# Patient Record
Sex: Female | Born: 1965
Health system: Southern US, Community
[De-identification: ages and names within clinical notes are randomized; demographics above are authoritative.]

## PROBLEM LIST (undated history)

## (undated) DIAGNOSIS — R112 Nausea with vomiting, unspecified: Secondary | ICD-10-CM

## (undated) DIAGNOSIS — L570 Actinic keratosis: Secondary | ICD-10-CM

## (undated) DIAGNOSIS — S42209A Unspecified fracture of upper end of unspecified humerus, initial encounter for closed fracture: Secondary | ICD-10-CM

## (undated) DIAGNOSIS — Z86718 Personal history of other venous thrombosis and embolism: Secondary | ICD-10-CM

## (undated) DIAGNOSIS — Z9889 Other specified postprocedural states: Secondary | ICD-10-CM

## (undated) DIAGNOSIS — K589 Irritable bowel syndrome without diarrhea: Secondary | ICD-10-CM

## (undated) HISTORY — DX: Irritable bowel syndrome without diarrhea: K58.9

## (undated) HISTORY — PX: FOOT SURGERY: SHX648

## (undated) HISTORY — DX: Personal history of other venous thrombosis and embolism: Z86.718

## (undated) HISTORY — PX: OVARIAN CYST SURGERY: SHX726

## (undated) HISTORY — DX: Actinic keratosis: L57.0

## (undated) HISTORY — PX: BREAST ENHANCEMENT SURGERY: SHX7

---

## 2010-12-05 ENCOUNTER — Ambulatory Visit: Payer: Self-pay | Admitting: Emergency Medicine

## 2010-12-05 DIAGNOSIS — M79609 Pain in unspecified limb: Secondary | ICD-10-CM

## 2011-01-21 NOTE — Assessment & Plan Note (Signed)
Summary: INJURED FINGER/TM   Vital Signs:  Patient Profile:   45 Years Old Female CC:      injured Rt 2nd digit Height:     66.5 inches Weight:      179.25 pounds O2 Sat:      100 % O2 treatment:    Room Air Temp:     98.2 degrees F oral Pulse rate:   74 / minute Resp:     18 per minute BP sitting:   104 / 66  (left arm) Cuff size:   regular  Vitals Entered By: Clemens Catholic LPN (December 05, 2010 12:39 PM)                  Updated Prior Medication List: SPRINTEC 28 0.25-35 MG-MCG TABS (NORGESTIMATE-ETH ESTRADIOL)   Current Allergies: No known allergies History of Present Illness Chief Complaint: injured Rt 2nd digit History of Present Illness: Pt complains of Rt 2nd finger pain. Location: Onset: 5 weeks Description/Quality of Pain:dull,throbbing Intensity of pain: mild  Modifying Factors: worse when bend Trauma: Jammed finger putting on a boot 5 wks ago assoc.Symptoms: swelled up immediately after jamming it 5 wks ago. Swelling much better now. No numbness or weakness.   REVIEW OF SYSTEMS Constitutional Symptoms      Denies fever, chills, night sweats, weight loss, weight gain, and fatigue.  Eyes       Denies change in vision, eye pain, eye discharge, glasses, contact lenses, and eye surgery. Ear/Nose/Throat/Mouth       Denies hearing loss/aids, change in hearing, ear pain, ear discharge, dizziness, frequent runny nose, frequent nose bleeds, sinus problems, sore throat, hoarseness, and tooth pain or bleeding.  Respiratory       Denies dry cough, productive cough, wheezing, shortness of breath, asthma, bronchitis, and emphysema/COPD.  Cardiovascular       Denies murmurs, chest pain, and tires easily with exhertion.    Gastrointestinal       Denies stomach pain, nausea/vomiting, diarrhea, constipation, blood in bowel movements, and indigestion. Genitourniary       Denies painful urination, kidney stones, and loss of urinary control. Neurological  Denies paralysis, seizures, and fainting/blackouts. Musculoskeletal       Complains of joint pain.      Denies muscle pain, joint stiffness, decreased range of motion, redness, swelling, muscle weakness, and gout.  Skin       Denies bruising, unusual mles/lumps or sores, and hair/skin or nail changes.  Psych       Denies mood changes, temper/anger issues, anxiety/stress, speech problems, depression, and sleep problems. Other Comments: pt injured RT 2nd digit x 4-5 wks ago still having swelling and throbbing on and off.   Past History:  Past Medical History: Unremarkable  Past Surgical History: ovarian cyst nasal surgery (polyps)  Vital Signs:  Patient profile:   45 Years Old Female Height:      66.5 inches Weight:      179.25 pounds O2 Sat:      100 % Temp:     98.2 degrees F oral Pulse rate:   74 / minute Resp:     18 per minute BP sitting:   104 / 66 Cuff size:   regular   Family History: Family History of Melanoma  Social History: Never Smoked Alcohol use-yes 2-3 times per yr Drug use-no Smoking Status:  never Drug Use:  no Physical Exam General appearance: well developed, well nourished, no acute distress Extremities: R index finger: minimal swelling and tenderness  DIP. Pain exacerbated by forced flexion. ROM,motor,sensory, tendon fxn all intact. No instability . Normal color and temperature. Nl capillary rf. Skin: no obvious rashes or lesions Assessment New Problems: FINGER PAIN (ICD-729.5) FAMILY HISTORY OF MELANOMA (ICD-V16.8)  xray normal, per radiologist  Patient Education: rom excersices discussed.  Plan New Orders: T-DG Finger Index*R* [73140] New Patient Level III [40981] Planning Comments:   Pt declined f/u appt with ortho. If not improved in 1-2 wks, pt will call back our office, and we can refer to othopedic hand specialist.   The patient and/or caregiver has been counseled thoroughly with regard to medications prescribed including dosage,  schedule, interactions, rationale for use, and possible side effects and they verbalize understanding.  Diagnoses and expected course of recovery discussed and will return if not improved as expected or if the condition worsens. Patient and/or caregiver verbalized understanding.   Orders Added: 1)  T-DG Finger Index*R* [73140] 2)  New Patient Level III [19147]

## 2012-04-17 DIAGNOSIS — Z85828 Personal history of other malignant neoplasm of skin: Secondary | ICD-10-CM | POA: Insufficient documentation

## 2012-04-17 DIAGNOSIS — L570 Actinic keratosis: Secondary | ICD-10-CM

## 2012-04-17 HISTORY — DX: Actinic keratosis: L57.0

## 2012-10-23 DIAGNOSIS — W891XXA Exposure to tanning bed, initial encounter: Secondary | ICD-10-CM | POA: Insufficient documentation

## 2013-10-29 DIAGNOSIS — Z808 Family history of malignant neoplasm of other organs or systems: Secondary | ICD-10-CM | POA: Insufficient documentation

## 2014-09-04 LAB — HM PAP SMEAR

## 2014-10-01 DIAGNOSIS — Z86718 Personal history of other venous thrombosis and embolism: Secondary | ICD-10-CM

## 2014-10-01 HISTORY — DX: Personal history of other venous thrombosis and embolism: Z86.718

## 2017-03-05 DIAGNOSIS — L03031 Cellulitis of right toe: Secondary | ICD-10-CM | POA: Diagnosis not present

## 2017-03-14 DIAGNOSIS — Z1231 Encounter for screening mammogram for malignant neoplasm of breast: Secondary | ICD-10-CM | POA: Diagnosis not present

## 2017-03-14 LAB — HM MAMMOGRAPHY

## 2017-06-04 DIAGNOSIS — R12 Heartburn: Secondary | ICD-10-CM | POA: Diagnosis not present

## 2017-06-04 DIAGNOSIS — R1011 Right upper quadrant pain: Secondary | ICD-10-CM | POA: Diagnosis not present

## 2017-06-04 DIAGNOSIS — R110 Nausea: Secondary | ICD-10-CM | POA: Diagnosis not present

## 2017-06-06 ENCOUNTER — Ambulatory Visit (INDEPENDENT_AMBULATORY_CARE_PROVIDER_SITE_OTHER): Payer: 59 | Admitting: Family Medicine

## 2017-06-06 ENCOUNTER — Encounter: Payer: Self-pay | Admitting: Family Medicine

## 2017-06-06 VITALS — BP 88/55 | HR 71 | Temp 98.3°F | Ht 66.0 in | Wt 148.0 lb

## 2017-06-06 DIAGNOSIS — K581 Irritable bowel syndrome with constipation: Secondary | ICD-10-CM | POA: Diagnosis not present

## 2017-06-06 DIAGNOSIS — R1013 Epigastric pain: Secondary | ICD-10-CM

## 2017-06-06 DIAGNOSIS — K589 Irritable bowel syndrome without diarrhea: Secondary | ICD-10-CM

## 2017-06-06 DIAGNOSIS — Z1211 Encounter for screening for malignant neoplasm of colon: Secondary | ICD-10-CM

## 2017-06-06 DIAGNOSIS — Z86718 Personal history of other venous thrombosis and embolism: Secondary | ICD-10-CM

## 2017-06-06 DIAGNOSIS — R109 Unspecified abdominal pain: Secondary | ICD-10-CM | POA: Insufficient documentation

## 2017-06-06 HISTORY — DX: Irritable bowel syndrome, unspecified: K58.9

## 2017-06-06 LAB — CBC
HEMATOCRIT: 39.1 % (ref 35.0–45.0)
Hemoglobin: 13.1 g/dL (ref 11.7–15.5)
MCH: 30.5 pg (ref 27.0–33.0)
MCHC: 33.5 g/dL (ref 32.0–36.0)
MCV: 90.9 fL (ref 80.0–100.0)
MPV: 9 fL (ref 7.5–12.5)
Platelets: 264 10*3/uL (ref 140–400)
RBC: 4.3 MIL/uL (ref 3.80–5.10)
RDW: 13.1 % (ref 11.0–15.0)
WBC: 5.2 10*3/uL (ref 3.8–10.8)

## 2017-06-06 MED ORDER — CELECOXIB 200 MG PO CAPS
ORAL_CAPSULE | ORAL | 2 refills | Status: DC
Start: 2017-06-06 — End: 2019-07-30

## 2017-06-06 MED ORDER — OMEPRAZOLE 40 MG PO CPDR
40.0000 mg | DELAYED_RELEASE_CAPSULE | Freq: Every day | ORAL | 3 refills | Status: DC
Start: 2017-06-06 — End: 2018-10-18

## 2017-06-06 MED ORDER — LUBIPROSTONE 24 MCG PO CAPS: 24.0000 ug | ORAL_CAPSULE | Freq: Two times a day (BID) | ORAL | 3 refills | Status: DC

## 2017-06-06 NOTE — Progress Notes (Signed)
Betty Lucas is a 51 y.o. female who presents to [place]: Betty Lucas today for epigastric abdominal pain and vomiting. Patient notes a six-month history of abdominal bloating and nausea and occasional vomiting. This is associated with episodes of acid reflux and sour taste in her mouth. Symptoms worsened about a week ago after meal and she developed vomiting. She was seen in urgent care where she was prescribed zofran which helped a bit. She also has tried an occasional Zantac which didn't help much at all either. She is a pertinent medical history for IBS typically constipation type. This is typically well controlled with Amitiza. Patient notes that she has been taking Aleve relatively frequently for episodes of pain.    Past Medical History:  Diagnosis Date  . Actinic keratosis 04/17/2012  . History of deep vein thrombosis (DVT) of lower extremity 10/01/2014   Rt after fracture around 2011  . IBS (irritable bowel syndrome) 06/06/2017   Past Surgical History:  Procedure Laterality Date  . BREAST ENHANCEMENT SURGERY    . FOOT SURGERY    . OVARIAN CYST SURGERY     Social History  Substance Use Topics  . Smoking status: Never Smoker  . Smokeless tobacco: Never Used  . Alcohol use Yes   family history includes Cancer in her father and paternal aunt; Hyperlipidemia in her mother.  ROS: Nonew headache, visual changes, nausea, vomiting, diarrhea, constipation, dizziness, abdominal pain, skin rash, fevers, chills, night sweats, weight loss, swollen lymph nodes, body aches, joint swelling, muscle aches, chest pain, shortness of breath, mood changes, visual or auditory hallucinations.    Medications: Current Outpatient Prescriptions  Medication Sig Dispense Refill  . Probiotic Product (PROBIOTIC DAILY PO) Take by mouth.    . celecoxib (CELEBREX) 200 MG capsule One to 2 tablets  by mouth daily as needed for pain. This replaces Aleve 60 capsule 2  . lubiprostone (AMITIZA) 24 MCG capsule Take 1 capsule (24 mcg total) by mouth 2 (two) times daily with a meal. 180 capsule 3  . omeprazole (PRILOSEC) 40 MG capsule Take 1 capsule (40 mg total) by mouth daily. 30 capsule 3   No current facility-administered medications for this visit.    No Known Allergies  Health Maintenance Health Maintenance  Topic Date Due  . HIV Screening  06/24/1981  . PAP SMEAR  06/25/1987  . MAMMOGRAM  06/24/2016  . COLONOSCOPY  06/24/2016  . INFLUENZA VACCINE  07/20/2017  . TETANUS/TDAP  12/07/2025     Exam:  BP (!) 88/55   Pulse 71   Temp 98.3 F (36.8 C) (Oral)   Ht 5\' 6"  (1.676 m)   Wt 148 lb (67.1 kg)   SpO2 98%   BMI 23.89 kg/m  Gen: Well NAD HEENT: EOMI,  MMM Lungs: Normal work of breathing. CTABL Heart: RRR no MRG Abd: NABS, Soft. Nondistended, Nontender no CV angle TTP.  Exts: Brisk capillary refill, warm and well perfused.    No results found for this or any previous visit (from the past 72 hour(s)). No results found.    Assessment and Plan: 51 y.o. female with  Epigastric abdominal pain I suspect this is probably gastritis. Patient uses NSAIDs and irregularly. I think this could certainly be a cause. Plan for workup including H. pylori test. Additionally we'll start treatment with omeprazole and switching from Aleve to Celebrex. Further workup will include CBC metabolic panel and lipase.  IBS: Refill Amitiza.  Screening: Referral to gastrology for screening colonoscopy. They may do an upper endoscopy at the same time if that is reasonably warranted at that time.   Orders Placed This Encounter  Procedures  . CBC  . COMPLETE METABOLIC PANEL WITH GFR  . Lipase  . H. pylori breath test  . Ambulatory referral to Gastroenterology    Referral Priority:   Routine    Referral Type:   Consultation    Referral Reason:   Specialty Services Required    Requested  Specialty:   Gastroenterology    Number of Visits Requested:   1   Meds ordered this encounter  Medications  . DISCONTD: doxycycline (VIBRA-TABS) 100 MG tablet  . DISCONTD: polyethylene glycol powder (GLYCOLAX/MIRALAX) powder  . DISCONTD: ondansetron (ZOFRAN-ODT) 4 MG disintegrating tablet  . Probiotic Product (PROBIOTIC DAILY PO)    Sig: Take by mouth.  . celecoxib (CELEBREX) 200 MG capsule    Sig: One to 2 tablets by mouth daily as needed for pain. This replaces Aleve    Dispense:  60 capsule    Refill:  2  . omeprazole (PRILOSEC) 40 MG capsule    Sig: Take 1 capsule (40 mg total) by mouth daily.    Dispense:  30 capsule    Refill:  3  . lubiprostone (AMITIZA) 24 MCG capsule    Sig: Take 1 capsule (24 mcg total) by mouth 2 (two) times daily with a meal.    Dispense:  180 capsule    Refill:  3     Discussed warning signs or symptoms. Please see discharge instructions. Patient expresses understanding.

## 2017-06-06 NOTE — Patient Instructions (Addendum)
Thank you for coming in today. Do the breath test and labs today.  We will start some medicine.  Start omeprazole daily.  Also stop aleve or ibuporofen. Use clebrex instead.   Recheck in 1 month.   You should hear about the colonscopy soon. Let me know if you do not hear anything.    Gastritis, Adult Gastritis is inflammation of the stomach. There are two kinds of gastritis:  Acute gastritis. This kind develops suddenly.  Chronic gastritis. This kind lasts for a long time.  Gastritis happens when the lining of the stomach becomes weak or gets damaged. Without treatment, gastritis can lead to stomach bleeding and ulcers. What are the causes? This condition may be caused by:  An infection.  Drinking too much alcohol.  Certain medicines.  Having too much acid in the stomach.  A disease of the intestines or stomach.  Stress.  What are the signs or symptoms? Symptoms of this condition include:  Pain or a burning in the upper abdomen.  Nausea.  Vomiting.  An uncomfortable feeling of fullness after eating.  In some cases, there are no symptoms. How is this diagnosed? This condition may be diagnosed with:  A description of your symptoms.  A physical exam.  Tests. These can include: ? Blood tests. ? Stool tests. ? A test in which a thin, flexible instrument with a light and camera on the end is passed down the esophagus and into the stomach (upper endoscopy). ? A test in which a sample of tissue is taken for testing (biopsy).  How is this treated? This condition may be treated with medicines. If the condition is caused by a bacterial infection, you may be given antibiotic medicines. If it is caused by too much acid in the stomach, you may get medicines called H2 blockers, proton pump inhibitors, or antacids. Treatment may also involve stopping the use of certain medicines, such as aspirin, ibuprofen, or other nonsteroidal anti-inflammatory drugs (NSAIDs). Follow  these instructions at home:  Take over-the-counter and prescription medicines only as told by your health care provider.  If you were prescribed an antibiotic, take it as told by your health care provider. Do not stop taking the antibiotic even if you start to feel better.  Drink enough fluid to keep your urine clear or pale yellow.  Eat small, frequent meals instead of large meals. Contact a health care provider if:  Your symptoms get worse.  Your symptoms return after treatment. Get help right away if:  You vomit blood or material that looks like coffee grounds.  You have black or dark red stools.  You are unable to keep fluids down.  Your abdominal pain gets worse.  You have a fever.  You do not feel better after 1 week. This information is not intended to replace advice given to you by your health care provider. Make sure you discuss any questions you have with your health care provider. Document Released: 11/30/2001 Document Revised: 08/04/2016 Document Reviewed: 08/30/2015 Elsevier Interactive Patient Education  2018 [employer].   Omeprazole tablets (OTC) What is this medicine? OMEPRAZOLE (oh ME pray zol) prevents the production of acid in the stomach. It is used to treat the symptoms of heartburn. You can buy this medicine without a prescription. This product is not for long-term use, unless otherwise directed by your doctor or health care professional. This medicine may be used for other purposes; ask your health care provider or pharmacist if you have questions. COMMON BRAND NAME(S):  Prilosec OTC What should I tell my health care provider before I take this medicine? They need to know if you have any of these conditions: -black or bloody stools -chest pain -difficulty swallowing -have had heartburn for over 3 months -have heartburn with dizziness, lightheadedness or sweating -liver disease -lupus -stomach pain -unexplained weight loss -vomiting with  blood -wheezing -an unusual or allergic reaction to omeprazole, other medicines, foods, dyes, or preservatives -pregnant or trying to get pregnant -breast-feeding How should I use this medicine? Take this medicine by mouth. Follow the directions on the product label. If you are taking this medicine without a prescription, take one tablet every day. Do not use for longer than 14 days or repeat a course of treatment more often than every 4 months unless directed by a doctor or healthcare professional. Take your dose at regular intervals every 24 hours. Swallow the tablet whole with a drink of water. Do not crush, break or chew. This medicine works best if taken on an empty stomach 30 minutes before breakfast. If you are using this medicine with the prescription of your doctor or healthcare professional, follow the directions you were given. Do not take your medicine more often than directed. Talk to your pediatrician regarding the use of this medicine in children. Special care may be needed. Overdosage: If you think you have taken too much of this medicine contact a poison control center or emergency room at once. NOTE: This medicine is only for you. Do not share this medicine with others. What if I miss a dose? If you miss a dose, take it as soon as you can. If it is almost time for your next dose, take only that dose. Do not take double or extra doses. What may interact with this medicine? Do not take this medicine with any of the following medications: -atazanavir -clopidogrel -nelfinavir This medicine may also interact with the following medications: -ampicillin -certain medicines for anxiety or sleep -certain medicines that treat or prevent blood clots like warfarin -cyclosporine -diazepam -digoxin -disulfiram -iron salts -methotrexate -mycophenolate mofetil -phenytoin -prescription medicine for fungal or yeast infection like itraconazole, ketoconazole,  voriconazole -saquinavir -tacrolimus This list may not describe all possible interactions. Give your health care provider a list of all the medicines, herbs, non-prescription drugs, or dietary supplements you use. Also tell them if you smoke, drink alcohol, or use illegal drugs. Some items may interact with your medicine. What should I watch for while using this medicine? It can take several days before your heartburn gets better. Check with your doctor or health care professional if your condition does not start to get better, or if it gets worse. Do not treat diarrhea with over the counter products. Contact your doctor if you have diarrhea that lasts more than 2 days or if it is severe and watery. Do not treat yourself for heartburn with this medicine for more than 14 days in a row. You should only use this medicine for a 2-week treatment period once every 4 months. If your symptoms return shortly after your therapy is complete, or within the 4 month time frame, call your doctor or health care professional. What side effects may I notice from receiving this medicine? Side effects that you should report to your doctor or health care professional as soon as possible: -allergic reactions like skin rash, itching or hives, swelling of the face, lips, or tongue -bone, muscle or joint pain -breathing problems -chest pain or chest tightness -dark yellow or brown  urine -diarrhea -dizziness -fast, irregular heartbeat -feeling faint or lightheaded -fever or sore throat -muscle spasm -palpitations -rash on cheeks or arms that gets worse in the sun -redness, blistering, peeling or loosening of the skin, including inside the mouth -seizures -tremors -unusual bleeding or bruising -unusually weak or tired -yellowing of the eyes or skin Side effects that usually do not require medical attention (report to your doctor or health care professional if they continue or are bothersome): -constipation -dry  mouth -headache -loose stools -nausea This list may not describe all possible side effects. Call your doctor for medical advice about side effects. You may report side effects to FDA at 1-800-FDA-1088. Where should I keep my medicine? Keep out of the reach of children. Store at room temperature between 20 and 25 degrees C (68 and 77 degrees F). Protect from light and moisture. Throw away any unused medicine after the expiration date. NOTE: This sheet is a summary. It may not cover all possible information. If you have questions about this medicine, talk to your doctor, pharmacist, or health care provider.  2018 Elsevier/Gold Standard (2016-01-08 13:06:31)   Celecoxib capsules What is this medicine? CELECOXIB (sell a KOX ib) is a non-steroidal anti-inflammatory drug (NSAID). This medicine is used to treat arthritis and ankylosing spondylitis. It may be also used for pain or painful monthly periods. This medicine may be used for other purposes; ask your health care provider or pharmacist if you have questions. COMMON BRAND NAME(S): Celebrex What should I tell my health care provider before I take this medicine? They need to know if you have any of these conditions: -asthma -coronary artery bypass graft (CABG) surgery within the past 2 weeks -drink more than 3 alcohol-containing drinks a day -heart disease or circulation problems like heart failure or leg edema (fluid retention) -high blood pressure -kidney disease -liver disease -stomach bleeding or ulcers -an unusual or allergic reaction to celecoxib, sulfa drugs, aspirin, other NSAIDs, other medicines, foods, dyes, or preservatives -pregnant or trying to get pregnant -breast-feeding How should I use this medicine? Take this medicine by mouth with a full glass of water. Follow the directions on the prescription label. Take it with food if it upsets your stomach or if you take 400 mg at one time. Try to not lie down for at least 10  minutes after you take the medicine. Take the medicine at the same time each day. Do not take more medicine than you are told to take. Long-term, continuous use may increase the risk of heart attack or stroke. A special MedGuide will be given to you by the pharmacist with each prescription and refill. Be sure to read this information carefully each time. Talk to your pediatrician regarding the use of this medicine in children. Special care may be needed. Overdosage: If you think you have taken too much of this medicine contact a poison control center or emergency room at once. NOTE: This medicine is only for you. Do not share this medicine with others. What if I miss a dose? If you miss a dose, take it as soon as you can. If it is almost time for your next dose, take only that dose. Do not take double or extra doses. What may interact with this medicine? Do not take this medicine with any of the following medications: -cidofovir -methotrexate -other NSAIDs, medicines for pain and inflammation, like ibuprofen or naproxen -pemetrexed This medicine may also interact with the following medications: -alcohol -aspirin and aspirin-like drugs -diuretics -fluconazole -  lithium -medicines for high blood pressure -steroid medicines like prednisone or cortisone -warfarin This list may not describe all possible interactions. Give your health care provider a list of all the medicines, herbs, non-prescription drugs, or dietary supplements you use. Also tell them if you smoke, drink alcohol, or use illegal drugs. Some items may interact with your medicine. What should I watch for while using this medicine? Tell your doctor or health care professional if your pain does not get better. Talk to your doctor before taking another medicine for pain. Do not treat yourself. This medicine does not prevent heart attack or stroke. In fact, this medicine may increase the chance of a heart attack or stroke. The chance  may increase with longer use of this medicine and in people who have heart disease. If you take aspirin to prevent heart attack or stroke, talk with your doctor or health care professional. Do not take medicines such as ibuprofen and naproxen with this medicine. Side effects such as stomach upset, nausea, or ulcers may be more likely to occur. Many medicines available without a prescription should not be taken with this medicine. This medicine can cause ulcers and bleeding in the stomach and intestines at any time during treatment. Ulcers and bleeding can happen without warning symptoms and can cause death. What side effects may I notice from receiving this medicine? Side effects that you should report to your doctor or health care professional as soon as possible: -allergic reactions like skin rash, itching or hives, swelling of the face, lips, or tongue -black or bloody stools, blood in the urine or vomit -blurred vision -breathing problems -chest pain -nausea, vomiting -problems with balance, talking, walking -redness, blistering, peeling or loosening of the skin, including inside the mouth -unexplained weight gain or swelling -unusually weak or tired -yellowing of eyes, skin Side effects that usually do not require medical attention (report to your doctor or health care professional if they continue or are bothersome): -constipation or diarrhea -dizziness -gas or heartburn -upset stomach This list may not describe all possible side effects. Call your doctor for medical advice about side effects. You may report side effects to FDA at 1-800-FDA-1088. Where should I keep my medicine? Keep out of the reach of children. Store at room temperature between 15 and 30 degrees C (59 and 86 degrees F). Keep container tightly closed. Throw away any unused medicine after the expiration date. NOTE: This sheet is a summary. It may not cover all possible information. If you have questions about this  medicine, talk to your doctor, pharmacist, or health care provider.  2018 Elsevier/Gold Standard (2010-02-04 10:54:17)

## 2017-06-07 LAB — COMPLETE METABOLIC PANEL WITH GFR
ALT: 16 U/L (ref 6–29)
AST: 17 U/L (ref 10–35)
Albumin: 4.5 g/dL (ref 3.6–5.1)
Alkaline Phosphatase: 55 U/L (ref 33–130)
BUN: 18 mg/dL (ref 7–25)
CALCIUM: 9.1 mg/dL (ref 8.6–10.4)
CHLORIDE: 100 mmol/L (ref 98–110)
CO2: 27 mmol/L (ref 20–31)
Creat: 0.81 mg/dL (ref 0.50–1.05)
GFR, EST NON AFRICAN AMERICAN: 85 mL/min (ref >=60)
Glucose, Bld: 82 mg/dL (ref 65–99)
POTASSIUM: 4.4 mmol/L (ref 3.5–5.3)
Sodium: 137 mmol/L (ref 135–146)
Total Bilirubin: 0.5 mg/dL (ref 0.2–1.2)
Total Protein: 6.7 g/dL (ref 6.1–8.1)

## 2017-06-07 LAB — H. PYLORI BREATH TEST: H. PYLORI BREATH TEST: NOT DETECTED

## 2017-06-07 LAB — LIPASE: LIPASE: 41 U/L (ref 7–60)

## 2017-06-10 ENCOUNTER — Encounter: Payer: Self-pay | Admitting: Family Medicine

## 2017-07-04 ENCOUNTER — Encounter: Payer: Self-pay | Admitting: Family Medicine

## 2017-07-04 ENCOUNTER — Ambulatory Visit (INDEPENDENT_AMBULATORY_CARE_PROVIDER_SITE_OTHER): Payer: 59 | Admitting: Family Medicine

## 2017-07-04 VITALS — BP 96/61 | HR 64 | Ht 66.0 in | Wt 154.0 lb

## 2017-07-04 DIAGNOSIS — Z0000 Encounter for general adult medical examination without abnormal findings: Secondary | ICD-10-CM

## 2017-07-04 DIAGNOSIS — K581 Irritable bowel syndrome with constipation: Secondary | ICD-10-CM | POA: Diagnosis not present

## 2017-07-04 DIAGNOSIS — K2900 Acute gastritis without bleeding: Secondary | ICD-10-CM

## 2017-07-04 DIAGNOSIS — Z1211 Encounter for screening for malignant neoplasm of colon: Secondary | ICD-10-CM | POA: Diagnosis not present

## 2017-07-04 MED ORDER — TRIAMCINOLONE ACETONIDE 0.1 % MT PSTE: 1.0000 "application " | PASTE | Freq: Two times a day (BID) | OROMUCOSAL | 11 refills | Status: DC

## 2017-07-04 MED ORDER — VALACYCLOVIR HCL 1 G PO TABS: 2000.0000 mg | ORAL_TABLET | Freq: Two times a day (BID) | ORAL | 2 refills | Status: DC

## 2017-07-04 NOTE — Patient Instructions (Signed)
Thank you for coming in today. You should hear from Gastroenterology soon.  You should be able to fill Amitiza.  Recheck yearly or sooner if needed.   USe valtrex 2 pills twice daily at the first sign of a cold sore.   USe triamcinolone paste for canker sore.    Canker Sores Canker sores are small, painful sores that develop inside your mouth. They may also be called aphthous ulcers. You can get canker sores on the inside of your lips or cheeks, on your tongue, or anywhere inside your mouth. You can have just one canker sore or several of them. Canker sores cannot be passed from one person to another (noncontagious). These sores are different than the sores that you may get on the outside of your lips (cold sores or fever blisters). Canker sores usually start as painful red bumps. Then they turn into small white, yellow, or gray ulcers that have red borders. The ulcers may be quite painful. The pain may be worse when you eat or drink. What are the causes? The cause of this condition is not known. What increases the risk? This condition is more likely to develop in:  Women.  People in their teens or 7s.  Women who are having their menstrual period.  People who are under a lot of emotional stress.  People who do not get enough iron or B vitamins.  People who have poor oral hygiene.  People who have an injury inside the mouth. This can happen after having dental work or from chewing something hard.  What are the signs or symptoms? Along with the canker sore, symptoms may also include:  Fever.  Fatigue.  Swollen lymph nodes in your neck.  How is this diagnosed? This condition can be diagnosed based on your symptoms. Your health care provider will also examine your mouth. Your health care provider may also do tests if you get canker sores often or if they are very bad. Tests may include:  Blood tests to rule out other causes of canker sores.  Taking swabs from the sore to  check for infection.  Taking a small piece of skin from the sore (biopsy) to test it for cancer.  How is this treated? Most canker sores clear up without treatment in about 10 days. Home care is usually the only treatment that you will need. Over-the-counter medicines can relieve discomfort.If you have severe canker sores, your health care provider may prescribe:  Numbing ointment to relieve pain.  Vitamins.  Steroid medicines. These may be given as: ? Oral pills. ? Mouth rinses. ? Gels.  Antibiotic mouth rinse.  Follow these instructions at home:  Apply, take, or use medicines only as directed by your health care provider. These include vitamins.  If you were prescribed an antibiotic mouth rinse, finish all of it even if you start to feel better.  Until the sores are healed: ? Do not drink coffee or citrus juices. ? Do not eat spicy or salty foods.  Use a mild, over-the-counter mouth rinse as directed by your health care provider.  Practice good oral hygiene. ? Floss your teeth every day. ? Brush your teeth with a soft brush twice each day. Contact a health care provider if:  Your symptoms do not get better after two weeks.  You also have a fever or swollen glands.  You get canker sores often.  You have a canker sore that is getting larger.  You cannot eat or drink due to your  canker sores. This information is not intended to replace advice given to you by your health care provider. Make sure you discuss any questions you have with your health care provider. Document Released: 04/02/2011 Document Revised: 05/13/2016 Document Reviewed: 11/06/2014 Elsevier Interactive Patient Education  2018 [employer].    Cold Sore A cold sore, also called a fever blister, is a skin infection that causes small, fluid-filled sores to form inside of the mouth or on the lips, gums, nose, chin, or cheeks. Cold sores can spread to other parts of the body, such as the eyes or fingers.  In some people with other medical conditions, cold sores can spread to multiple other body sites, including the genitals. Cold sores can be spread or passed from person to person (contagious) until the sores crust over completely. What are the causes? Cold sores are caused by the herpes simplex virus (HSV-1). HSV-1 is closely related to the virus that causes genital herpes (HSV-2), but these viruses are not the same. Once a person is infected with HSV-1, the virus remains permanently in the body. HSV-1 is spread from person to person through close contact, such as through kissing, touching the affected area, or sharing personal items such as lip balm, razors, or eating utensils. What increases the risk? A cold sore outbreak is more likely to develop in people who:  Are tired, stressed, or sick.  Are menstruating.  Are pregnant.  Take certain medicines.  Are exposed to cold weather or too much sun.  What are the signs or symptoms? Symptoms of a cold sore outbreak often go through different stages. Here is how a cold sore develops:  Tingling, itching, or burning is felt 1-2 days before the outbreak.  Fluid-filled blisters appear on the lips, inside the mouth, on the nose, or on the cheeks.  The blisters start to ooze clear fluid.  The blisters dry up and a yellow crust appears in its place.  The crust falls off.  Other symptoms include:  Fever.  Sore throat.  Headache.  Muscle aches.  Swollen neck glands.  You also may not have any symptoms. How is this diagnosed? This condition is often diagnosed based on your medical history and a physical exam. Your health care provider may swab your sore and then examine it in the lab. Rarely, blood tests may be done to check for HSV-1. How is this treated? There is no cure for cold sores or HSV-1. There also is no vaccine for HSV-1. Most cold sores go away on their own without treatment within two weeks. Medicines cannot make the  infection go away, but medicines can:  Help relieve some of the pain associated with the sores.  Work to stop the virus from multiplying.  Shorten healing time.  Medicines may be in the form of creams, gels, pills, or a shot. Follow these instructions at home: Medicines  Take or apply over-the-counter and prescription medicines only as told by your health care provider.  Use a cotton-tip swab to apply creams or gels to your sores. Sore Care  Do not touch the sores or pick the scabs.  Wash your hands often. Do not touch your eyes without washing your hands first.  Keep the sores clean and dry.  If directed, apply ice to the sores: ? Put ice in a plastic bag. ? Place a towel between your skin and the bag. ? Leave the ice on for 20 minutes, 2-3 times per day. Lifestyle  Do not kiss, have  oral sex, or share personal items until your sores heal.  Eat a soft, bland diet. Avoid eating hot, cold, or salty foods. These can hurt your mouth.  Use a straw if it hurts to drink out of a glass.  Avoid the sun and limit your stress if these things trigger outbreaks. If sun causes cold sores, apply sunscreen on your lips before being out in the sun. Contact a health care provider if:  You have symptoms for more than two weeks.  You have pus coming from the sores.  You have redness that is spreading.  You have pain or irritation in your eye.  You get sores on your genitals.  Your sores do not heal within two weeks.  You have frequent cold sore outbreaks. Get help right away if:  You have a fever and your symptoms suddenly get worse.  You have a headache and confusion. This information is not intended to replace advice given to you by your health care provider. Make sure you discuss any questions you have with your health care provider. Document Released: 12/03/2000 Document Revised: 07/30/2016 Document Reviewed: 09/26/2015 Elsevier Interactive Patient Education  [employer].

## 2017-07-04 NOTE — Progress Notes (Signed)
       Betty Lucas is a 51 y.o. female who presents to [facility]: [place] today for well adult visit. Darlin is doing well. She was seen recently for epigastric abdominal pain and treated empirically for gastritis with omeprazole. She notes that she is doing well and no longer has any abdominal pain. She notes that she is due for colonoscopy in the near future and thinks probably she should have an upper endoscopy as well as she continues to have reflux symptoms. She denies any fevers or chills. She exercises regularly and tries to watch her diet. She has a history of lots of skin damage in the past and has irregular appointment with dermatology for 6 months.   Past Medical History:  Diagnosis Date  . Actinic keratosis 04/17/2012  . History of deep vein thrombosis (DVT) of lower extremity 10/01/2014   Rt after fracture around 2011  . IBS (irritable bowel syndrome) 06/06/2017   Past Surgical History:  Procedure Laterality Date  . BREAST ENHANCEMENT SURGERY    . FOOT SURGERY    . OVARIAN CYST SURGERY     Social History  Substance Use Topics  . Smoking status: Never Smoker  . Smokeless tobacco: Never Used  . Alcohol use Yes   family history includes Cancer in her father and paternal aunt; Hyperlipidemia in her mother.  ROS as above:  Medications: Current Outpatient Prescriptions  Medication Sig Dispense Refill  . celecoxib (CELEBREX) 200 MG capsule One to 2 tablets by mouth daily as needed for pain. This replaces Aleve 60 capsule 2  . omeprazole (PRILOSEC) 40 MG capsule Take 1 capsule (40 mg total) by mouth daily. 30 capsule 3  . Probiotic Product (PROBIOTIC DAILY PO) Take by mouth.     No current facility-administered medications for this visit.    No Known Allergies  Health Maintenance Health Maintenance  Topic Date Due  . HIV Screening  06/24/1981  . COLONOSCOPY   06/24/2016  . INFLUENZA VACCINE  07/20/2017  . MAMMOGRAM  03/15/2019  . PAP SMEAR  09/05/2019  . TETANUS/TDAP  12/07/2025     Exam:  BP 96/61   Pulse 64   Ht 5\' 6"  (1.676 m)   Wt 154 lb (69.9 kg)   BMI 24.86 kg/m  Gen: Well NAD HEENT: EOMI,  MMM Lungs: Normal work of breathing. CTABL Heart: RRR no MRG Abd: NABS, Soft. Nondistended, Nontender Exts: Brisk capillary refill, warm and well perfused.    No results found for this or any previous visit (from the past 72 hour(s)). No results found.    Assessment and Plan: 51 y.o. female with Well adult. Doing well. Plan to continue dermatology appointments every 6 months. Additionally well obtain fasting lab history from previous primary care provider obtained in December 2017. Plan to continue regular exercise and improve diet. Recheck annually or sooner if needed.   Orders Placed This Encounter  Procedures  . Ambulatory referral to Gastroenterology    Referral Priority:   Routine    Referral Type:   Consultation    Referral Reason:   Specialty Services Required    Number of Visits Requested:   1   No orders of the defined types were placed in this encounter.    Discussed warning signs or symptoms. Please see discharge instructions. Patient expresses understanding.

## 2017-07-06 ENCOUNTER — Encounter: Payer: Self-pay | Admitting: Family Medicine

## 2017-07-13 DIAGNOSIS — Z1211 Encounter for screening for malignant neoplasm of colon: Secondary | ICD-10-CM | POA: Diagnosis not present

## 2017-07-13 DIAGNOSIS — R1013 Epigastric pain: Secondary | ICD-10-CM | POA: Diagnosis not present

## 2017-07-13 DIAGNOSIS — R112 Nausea with vomiting, unspecified: Secondary | ICD-10-CM | POA: Diagnosis not present

## 2017-09-05 DIAGNOSIS — Z1211 Encounter for screening for malignant neoplasm of colon: Secondary | ICD-10-CM | POA: Diagnosis not present

## 2017-09-05 DIAGNOSIS — R112 Nausea with vomiting, unspecified: Secondary | ICD-10-CM | POA: Diagnosis not present

## 2017-09-05 DIAGNOSIS — Z538 Procedure and treatment not carried out for other reasons: Secondary | ICD-10-CM | POA: Diagnosis not present

## 2017-09-06 DIAGNOSIS — Z1211 Encounter for screening for malignant neoplasm of colon: Secondary | ICD-10-CM | POA: Diagnosis not present

## 2017-09-06 LAB — HM COLONOSCOPY

## 2017-09-16 ENCOUNTER — Encounter: Payer: Self-pay | Admitting: Family Medicine

## 2017-09-19 DIAGNOSIS — Z85828 Personal history of other malignant neoplasm of skin: Secondary | ICD-10-CM | POA: Diagnosis not present

## 2017-09-19 DIAGNOSIS — L570 Actinic keratosis: Secondary | ICD-10-CM | POA: Diagnosis not present

## 2017-09-19 DIAGNOSIS — D225 Melanocytic nevi of trunk: Secondary | ICD-10-CM | POA: Diagnosis not present

## 2017-09-19 DIAGNOSIS — Z08 Encounter for follow-up examination after completed treatment for malignant neoplasm: Secondary | ICD-10-CM | POA: Diagnosis not present

## 2017-10-26 DIAGNOSIS — Z01419 Encounter for gynecological examination (general) (routine) without abnormal findings: Secondary | ICD-10-CM | POA: Diagnosis not present

## 2018-04-19 DIAGNOSIS — L578 Other skin changes due to chronic exposure to nonionizing radiation: Secondary | ICD-10-CM | POA: Diagnosis not present

## 2018-04-19 DIAGNOSIS — Z08 Encounter for follow-up examination after completed treatment for malignant neoplasm: Secondary | ICD-10-CM | POA: Diagnosis not present

## 2018-04-19 DIAGNOSIS — L821 Other seborrheic keratosis: Secondary | ICD-10-CM | POA: Diagnosis not present

## 2018-06-05 DIAGNOSIS — Z1231 Encounter for screening mammogram for malignant neoplasm of breast: Secondary | ICD-10-CM | POA: Diagnosis not present

## 2018-06-05 LAB — HM MAMMOGRAPHY

## 2018-07-14 ENCOUNTER — Encounter: Payer: Self-pay | Admitting: Family Medicine

## 2018-07-26 ENCOUNTER — Other Ambulatory Visit: Payer: Self-pay | Admitting: Family Medicine

## 2018-07-26 DIAGNOSIS — K581 Irritable bowel syndrome with constipation: Secondary | ICD-10-CM

## 2018-09-01 ENCOUNTER — Other Ambulatory Visit: Payer: Self-pay | Admitting: Family Medicine

## 2018-09-01 DIAGNOSIS — K581 Irritable bowel syndrome with constipation: Secondary | ICD-10-CM

## 2018-10-18 ENCOUNTER — Ambulatory Visit (INDEPENDENT_AMBULATORY_CARE_PROVIDER_SITE_OTHER): Payer: 59 | Admitting: Family Medicine

## 2018-10-18 ENCOUNTER — Encounter: Payer: Self-pay | Admitting: Family Medicine

## 2018-10-18 VITALS — BP 111/55 | HR 61 | Ht 66.0 in | Wt 162.0 lb

## 2018-10-18 DIAGNOSIS — T50905A Adverse effect of unspecified drugs, medicaments and biological substances, initial encounter: Secondary | ICD-10-CM

## 2018-10-18 DIAGNOSIS — Z0000 Encounter for general adult medical examination without abnormal findings: Secondary | ICD-10-CM

## 2018-10-18 DIAGNOSIS — R232 Flushing: Secondary | ICD-10-CM | POA: Diagnosis not present

## 2018-10-18 DIAGNOSIS — K581 Irritable bowel syndrome with constipation: Secondary | ICD-10-CM | POA: Diagnosis not present

## 2018-10-18 DIAGNOSIS — K219 Gastro-esophageal reflux disease without esophagitis: Secondary | ICD-10-CM

## 2018-10-18 DIAGNOSIS — Z86718 Personal history of other venous thrombosis and embolism: Secondary | ICD-10-CM

## 2018-10-18 DIAGNOSIS — Z6826 Body mass index (BMI) 26.0-26.9, adult: Secondary | ICD-10-CM

## 2018-10-18 MED ORDER — VALACYCLOVIR HCL 1 G PO TABS: 2000.0000 mg | ORAL_TABLET | Freq: Two times a day (BID) | ORAL | 2 refills | Status: DC

## 2018-10-18 MED ORDER — OMEPRAZOLE 40 MG PO CPDR: 40.0000 mg | DELAYED_RELEASE_CAPSULE | Freq: Every day | ORAL | 3 refills | Status: DC

## 2018-10-18 MED ORDER — LUBIPROSTONE 24 MCG PO CAPS: ORAL_CAPSULE | ORAL | 2 refills | Status: DC

## 2018-10-18 NOTE — Progress Notes (Signed)
Betty Lucas is a 52 y.o. female who presents to [facility]: [place] today for well adult visit.   Betty Lucas is doing well overall.  She remains physically fit and exercises regularly.  She eats a healthy diet low in carbohydrates.  She notes that she is gained a little bit of weight over the interval because she is no longer doing body building.  She feels well with how things are going in her body however and thinks that she is in a pretty healthy body fat percentage in weight.  She does continue to exercise and lift weights.  Additionally Betty Lucas thinks that she is starting to have menopause symptoms.  She notes some irregular periods as well as hot flashes.  She starting to have a bit more anxiety than is typical for her.  She like to avoid medications notes that symptoms right now are mild.  Her main problem is hot flashes sometimes wake her from sleep.  She has not tried any treatment yet.  She notes her IBS is typically quite well controlled with amities a.  She notes she does have some acid reflux burning sensation.  She is been prescribed omeprazole but does not take it regularly.  Symptoms are worse with spicy foods.  Additionally she notes that she is developing a bit of a bunion in her feet bilaterally.  She has a history of right midfoot fracture with surgery.  She notes her feet and not particularly bother her very much.     ROS as above:  Past Medical History:  Diagnosis Date  . Actinic keratosis 04/17/2012  . History of deep vein thrombosis (DVT) of lower extremity 10/01/2014   Rt after fracture around 2011  . IBS (irritable bowel syndrome) 06/06/2017   Past Surgical History:  Procedure Laterality Date  . BREAST ENHANCEMENT SURGERY    . FOOT SURGERY    . OVARIAN CYST SURGERY     Social History   Tobacco Use  . Smoking status: Never Smoker  . Smokeless  tobacco: Never Used  Substance Use Topics  . Alcohol use: Yes   family history includes Cancer in her father and paternal aunt; Hyperlipidemia in her mother.  Medications: Current Outpatient Medications  Medication Sig Dispense Refill  . celecoxib (CELEBREX) 200 MG capsule One to 2 tablets by mouth daily as needed for pain. This replaces Aleve 60 capsule 2  . lubiprostone (AMITIZA) 24 MCG capsule TAKE 1 CAPSULE BY MOUTH 2 (TWO) TIMES DAILY WITH A MEAL. 90 capsule 2  . omeprazole (PRILOSEC) 40 MG capsule Take 1 capsule (40 mg total) by mouth daily. 30 capsule 3  . Probiotic Product (PROBIOTIC DAILY PO) Take by mouth.    . triamcinolone (KENALOG) 0.1 % paste Use as directed 1 application in the mouth or throat 2 (two) times daily. For canker sores until resolved. 5 g 11  . valACYclovir (VALTREX) 1000 MG tablet Take 2 tablets (2,000 mg total) by mouth 2 (two) times daily. At start of cold sore symptoms 4 tablet 2   No current facility-administered medications for this visit.    No Known Allergies  Health Maintenance Health Maintenance  Topic Date Due  . HIV Screening  06[birth year]1981  . INFLUENZA VACCINE  10/19/2019 (Originally 07/20/2018)  . PAP SMEAR  09/05/2019  . MAMMOGRAM  06/05/2020  . TETANUS/TDAP  12/07/2025  . COLONOSCOPY  09/07/2027     Exam:  BP (!) 111/55   Pulse  61   Ht 5\' 6"  (1.676 m)   Wt 162 lb (73.5 kg)   BMI 26.15 kg/m  Wt Readings from Last 5 Encounters:  10/18/18 162 lb (73.5 kg)  07/04/17 154 lb (69.9 kg)  06/06/17 148 lb (67.1 kg)     Gen: Well NAD HEENT: EOMI,  MMM Lungs: Normal work of breathing. CTABL Heart: RRR no MRG Abd: NABS, Soft. Nondistended, Nontender Exts: Brisk capillary refill, warm and well perfused.  Psych: Alert and oriented normal speech thought process and affect. Right foot well-appearing scar dorsal midline midfoot.  Cavus arch mild bunion formation. Left foot well-appearing cavus appearing foot mild bunion  formation.  Depression screen [facility] 2/9 10/18/2018 10/18/2018 07/04/2017 06/06/2017  Decreased Interest 0 0 0 0  Down, Depressed, Hopeless 1 0 0 1  PHQ - 2 Score 1 0 0 1  Altered sleeping 2 - - -  Tired, decreased energy 0 - - -  Change in appetite 0 - - -  Feeling bad or failure about yourself  0 - - -  Trouble concentrating 1 - - -  Suicidal thoughts 0 - - -  PHQ-9 Score 4 - - -  Difficult doing work/chores Not difficult at all - - -    GAD 7 : Generalized Anxiety Score 10/18/2018  Nervous, Anxious, on Edge 1  Control/stop worrying 1  Worry too much - different things 3  Trouble relaxing 1  Restless 1  Easily annoyed or irritable 1  Afraid - awful might happen 0  Total GAD 7 Score 8  Anxiety Difficulty Not difficult at all      Assessment and Plan: 52 y.o. female with  Well adult.  Doing reasonably well.  Patient starting to have some perimenopausal symptoms and some slightly worsened anxiety.  Plan for watchful waiting with lifestyle modification including exercise sleep hygiene and some self-guided therapy exercises.  Consider black cohosh as well.  Discussed medication options.  Watchful waiting for now.  BMI 26: Patient has high percentage muscle mass and has a good body fat percentage.  Watchful waiting recommend slight calorie restriction down to about 100 to 200 cal/day less than current intake.  Continue exercise.  Acid reflux symptoms restart omeprazole.  IBS continue Amitiza.  Check basic fasting labs listed below.  HIV screening ordered as well. Cervical cancer and breast cancer screening up-to-date. Colon cancer screening up-to-date. Flu vaccine declined.   Orders Placed This Encounter  Procedures  . CBC  . COMPLETE METABOLIC PANEL WITH GFR  . Lipid Panel w/reflex Direct LDL  . HIV Antibody (routine testing w rflx)  . TSH   Meds ordered this encounter  Medications  . lubiprostone (AMITIZA) 24 MCG capsule    Sig: TAKE 1 CAPSULE BY MOUTH 2 (TWO) TIMES  DAILY WITH A MEAL.    Dispense:  90 capsule    Refill:  2  . omeprazole (PRILOSEC) 40 MG capsule    Sig: Take 1 capsule (40 mg total) by mouth daily.    Dispense:  30 capsule    Refill:  3  . valACYclovir (VALTREX) 1000 MG tablet    Sig: Take 2 tablets (2,000 mg total) by mouth 2 (two) times daily. At start of cold sore symptoms    Dispense:  4 tablet    Refill:  2     Discussed warning signs or symptoms. Please see discharge instructions. Patient expresses understanding.

## 2018-10-18 NOTE — Patient Instructions (Addendum)
Thank you for coming in today.  We will continue to pay attention to anxiety and peri-menopause.  Consider Black cohosh.  Continue to exercise and get sleep.   Consider decreasing calories by 100-200 calories per day.    We can make orthotics if your feet hurt.    Get labs now.

## 2018-10-19 ENCOUNTER — Encounter: Payer: Self-pay | Admitting: Family Medicine

## 2018-10-19 LAB — COMPLETE METABOLIC PANEL WITH GFR
AG Ratio: 2.4 (calc) (ref 1.0–2.5)
ALKALINE PHOSPHATASE (APISO): 43 U/L (ref 33–130)
ALT: 11 U/L (ref 6–29)
AST: 15 U/L (ref 10–35)
Albumin: 4.6 g/dL (ref 3.6–5.1)
BUN: 15 mg/dL (ref 7–25)
CO2: 29 mmol/L (ref 20–32)
CREATININE: 0.87 mg/dL (ref 0.50–1.05)
Calcium: 9.1 mg/dL (ref 8.6–10.4)
Chloride: 106 mmol/L (ref 98–110)
GFR, Est African American: 89 mL/min/{1.73_m2} (ref >=60)
GFR, Est Non African American: 77 mL/min/{1.73_m2} (ref >=60)
GLUCOSE: 89 mg/dL (ref 65–99)
Globulin: 1.9 g/dL (calc) (ref 1.9–3.7)
Potassium: 4.3 mmol/L (ref 3.5–5.3)
Sodium: 141 mmol/L (ref 135–146)
Total Bilirubin: 0.5 mg/dL (ref 0.2–1.2)
Total Protein: 6.5 g/dL (ref 6.1–8.1)

## 2018-10-19 LAB — LIPID PANEL W/REFLEX DIRECT LDL
CHOL/HDL RATIO: 3.2 (calc) (ref <5.0)
CHOLESTEROL: 213 mg/dL — AB (ref <200)
HDL: 67 mg/dL (ref >50)
LDL Cholesterol (Calc): 132 mg/dL (calc) — ABNORMAL HIGH
Non-HDL Cholesterol (Calc): 146 mg/dL (calc) — ABNORMAL HIGH (ref <130)
Triglycerides: 51 mg/dL (ref <150)

## 2018-10-19 LAB — CBC
HCT: 36.2 % (ref 35.0–45.0)
HEMOGLOBIN: 12.1 g/dL (ref 11.7–15.5)
MCH: 30.4 pg (ref 27.0–33.0)
MCHC: 33.4 g/dL (ref 32.0–36.0)
MCV: 91 fL (ref 80.0–100.0)
MPV: 9.7 fL (ref 7.5–12.5)
Platelets: 290 10*3/uL (ref 140–400)
RBC: 3.98 10*6/uL (ref 3.80–5.10)
RDW: 11.9 % (ref 11.0–15.0)
WBC: 4.4 10*3/uL (ref 3.8–10.8)

## 2018-10-19 LAB — TSH: TSH: 2.6 mIU/L

## 2018-10-19 LAB — HIV ANTIBODY (ROUTINE TESTING W REFLEX): HIV: NONREACTIVE

## 2018-11-06 DIAGNOSIS — Z6827 Body mass index (BMI) 27.0-27.9, adult: Secondary | ICD-10-CM | POA: Diagnosis not present

## 2018-11-06 DIAGNOSIS — Z01419 Encounter for gynecological examination (general) (routine) without abnormal findings: Secondary | ICD-10-CM | POA: Diagnosis not present

## 2018-11-16 ENCOUNTER — Other Ambulatory Visit: Payer: Self-pay | Admitting: Family Medicine

## 2018-11-16 DIAGNOSIS — K581 Irritable bowel syndrome with constipation: Secondary | ICD-10-CM

## 2018-11-29 DIAGNOSIS — C44529 Squamous cell carcinoma of skin of other part of trunk: Secondary | ICD-10-CM | POA: Diagnosis not present

## 2018-11-29 DIAGNOSIS — R238 Other skin changes: Secondary | ICD-10-CM | POA: Diagnosis not present

## 2018-12-16 ENCOUNTER — Other Ambulatory Visit: Payer: Self-pay | Admitting: Family Medicine

## 2019-02-12 DIAGNOSIS — C44529 Squamous cell carcinoma of skin of other part of trunk: Secondary | ICD-10-CM | POA: Diagnosis not present

## 2019-02-27 ENCOUNTER — Ambulatory Visit (INDEPENDENT_AMBULATORY_CARE_PROVIDER_SITE_OTHER): Payer: 59 | Admitting: Family Medicine

## 2019-02-27 ENCOUNTER — Encounter: Payer: Self-pay | Admitting: Family Medicine

## 2019-02-27 VITALS — BP 112/55 | HR 67 | Ht 66.0 in | Wt 166.0 lb

## 2019-02-27 DIAGNOSIS — R0989 Other specified symptoms and signs involving the circulatory and respiratory systems: Secondary | ICD-10-CM | POA: Diagnosis not present

## 2019-02-27 DIAGNOSIS — J0100 Acute maxillary sinusitis, unspecified: Secondary | ICD-10-CM | POA: Diagnosis not present

## 2019-02-27 MED ORDER — CEFDINIR 300 MG PO CAPS: 300.0000 mg | ORAL_CAPSULE | Freq: Two times a day (BID) | ORAL | 0 refills | Status: DC

## 2019-02-27 MED ORDER — AZELASTINE HCL 0.1 % NA SOLN: 2.0000 | Freq: Two times a day (BID) | NASAL | 12 refills | Status: DC

## 2019-02-27 MED ORDER — OMEPRAZOLE 40 MG PO CPDR: DELAYED_RELEASE_CAPSULE | ORAL | 1 refills | Status: DC

## 2019-02-27 NOTE — Patient Instructions (Addendum)
Thank you for coming in today.  Continue flonase nasal spray.  Add astelin nasal spray.  Take omepreazole daily.   Let me know if not improving.   If your facial pressure worsens especially on one side fill and take omniecf antibiotic.   Globus Pharyngeus Globus pharyngeus is a condition that makes it feel l. you have a lump in your throat. It may also feel like you have something stuck in the front of your throat. This feeling may come and go. It is not painful, and it does not make it harder to swallow food or liquid. Globus pharyngeus does not cause changes that a health care provider can see during a physical exam. This condition usually goes away without treatment. What are the causes? Often, no cause can be found. The most common cause of globus pharyngeus is a condition that causes stomach juices to flow back up into the throat (gastroesophageal reflux). Other possible causes include:  Overstimulation of nerves that control swallowing.  Irritation of nerves that control swallowing (neuralgia).  An enlarged gland in the lower neck (thyroid gland).  Growth of tonsil tissue at the base of the tongue (lingual tonsil).  Anxiety.  Depression. What are the signs or symptoms? The main symptom of this condition is a feeling of a lump in your throat. This feeling usually comes and goes. How is this diagnosed? This condition may be diagnosed after other conditions have been ruled out. You may have tests, such as:  A swallow study.  Ear, nose, and throat evaluation.  An exam of your throat using a thin, flexible tube with a light and camera on the end (endoscopy). How is this treated? This condition may go away on its own, without treatment. In some cases, antidepressant medicines may be helpful. Follow these instructions at home:   Follow instructions from your health care provider about eating or drinking restrictions.  Take over-the-counter and prescription medicines only  as told by your health care provider.  Keep all follow-up visits as told by your health care provider. This is important.  Follow instructions from your health care provider about home care for gastroesophageal reflux. Your health care provider may recommend that you: ? Do not eat or drink anything that causes heartburn. ? Do not eat heavy meals close to bedtime. ? Do not drink caffeine. ? Do not drink alcohol. ? Raise the head of your bed. ? Sleep on your left side. Contact a health care provider if:  Your symptoms get worse.  You have throat pain.  You have trouble swallowing.  Food or liquid comes back up into your mouth.  You lose weight without trying. Get help right away if:  You develop swelling in your throat. Summary  Globus pharyngeus is a condition that makes it feel like you have a lump in your throat.  This condition usually goes away without treatment. This information is not intended to replace advice given to you by your health care provider. Make sure you discuss any questions you have with your health care provider. Document Released: 08/11/2016 Document Revised: 08/11/2016 Document Reviewed: 08/11/2016 Elsevier Interactive Patient Education  2019 [employer].

## 2019-02-27 NOTE — Progress Notes (Signed)
Betty Lucas is a 53 y.o. female who presents to [facility] today for 8-day history of sensation in her throat.  Betty Lucas notes an 8-day history of a sensation of something stuck in her throat.  She is able to swallow solids and liquids with no problem.  She denies any trouble breathing or swallowing or speaking.  She feels some sinus pressure as well as some postnasal drainage.  She just noted using Flonase which has not helped yet.  She also takes omeprazole intermittently but has not taken it frequently for this issue yet.  She denies any fevers or chills nausea vomiting or diarrhea.  She notes a little bit of facial pain and pressure is consistent with early symptoms of sinus infection.   ROS as above:  Exam:  BP (!) 112/55   Pulse 67   Ht 5\' 6"  (1.676 m)   Wt 166 lb (75.3 kg)   BMI 26.79 kg/m  Wt Readings from Last 5 Encounters:  02/27/19 166 lb (75.3 kg)  10/18/18 162 lb (73.5 kg)  07/04/17 154 lb (69.9 kg)  06/06/17 148 lb (67.1 kg)    Gen: Well NAD HEENT: EOMI,  MMM nontender maxillary sinuses.  Normal tympanic membranes.  Mild cervical lymphadenopathy.  Posterior pharynx with mild cobblestoning.  No goiter palpated. Lungs: Normal work of breathing. CTABL Heart: RRR no MRG Abd: NABS, Soft. Nondistended, Nontender Exts: Brisk capillary refill, warm and well perfused.   Lab and Radiology Results No results found for this or any previous visit (from the past 72 hour(s)). No results found.    Assessment and Plan: 53 y.o. female with  Globus sensation likely due to acid reflux and possibly postnasal drainage.  Will treat for both issues with Flonase nasal spray, Astelin nasal spray, and omeprazole.  If worsening especially with sinus symptoms patient has a printed prescription of Omnicef that she can fill and start taking if needed.  However she will  not take it now as it likely will not be effective.  PDMP not reviewed this encounter. No orders of the defined types were placed in this encounter.  Meds ordered this encounter  Medications  . azelastine (ASTELIN) 0.1 % nasal spray    Sig: Place 2 sprays into both nostrils 2 (two) times daily. Use in each nostril as directed    Dispense:  30 mL    Refill:  12  . omeprazole (PRILOSEC) 40 MG capsule    Sig: TAKE 1 CAPSULE BY MOUTH EVERY DAY    Dispense:  90 capsule    Refill:  1  . cefdinir (OMNICEF) 300 MG capsule    Sig: Take 1 capsule (300 mg total) by mouth 2 (two) times daily.    Dispense:  14 capsule    Refill:  0     Historical information moved to improve visibility of documentation.  Past Medical History:  Diagnosis Date  . Actinic keratosis 04/17/2012  . History of deep vein thrombosis (DVT) of lower extremity 10/01/2014   Rt after fracture around 2011  . IBS (irritable bowel syndrome) 06/06/2017   Past Surgical History:  Procedure Laterality Date  . BREAST ENHANCEMENT SURGERY    . FOOT SURGERY    . OVARIAN CYST SURGERY     Social History   Tobacco Use  . Smoking status: Never Smoker  . Smokeless tobacco: Never Used  Substance Use Topics  . Alcohol use: Yes   family  history includes Cancer in her father and paternal aunt; Hyperlipidemia in her mother.  Medications: Current Outpatient Medications  Medication Sig Dispense Refill  . celecoxib (CELEBREX) 200 MG capsule One to 2 tablets by mouth daily as needed for pain. This replaces Aleve 60 capsule 2  . lubiprostone (AMITIZA) 24 MCG capsule TAKE 1 CAPSULE BY MOUTH 2 (TWO) TIMES DAILY WITH A MEAL. 180 capsule 2  . omeprazole (PRILOSEC) 40 MG capsule TAKE 1 CAPSULE BY MOUTH EVERY DAY 90 capsule 1  . Probiotic Product (PROBIOTIC DAILY PO) Take by mouth.    . triamcinolone (KENALOG) 0.1 % paste Use as directed 1 application in the mouth or throat 2 (two) times daily. For canker sores until resolved. 5 g 11  .  valACYclovir (VALTREX) 1000 MG tablet Take 2 tablets (2,000 mg total) by mouth 2 (two) times daily. At start of cold sore symptoms 4 tablet 2  . azelastine (ASTELIN) 0.1 % nasal spray Place 2 sprays into both nostrils 2 (two) times daily. Use in each nostril as directed 30 mL 12  . cefdinir (OMNICEF) 300 MG capsule Take 1 capsule (300 mg total) by mouth 2 (two) times daily. 14 capsule 0   No current facility-administered medications for this visit.    No Known Allergies   Discussed warning signs or symptoms. Please see discharge instructions. Patient expresses understanding.

## 2019-02-28 ENCOUNTER — Encounter: Payer: Self-pay | Admitting: Family Medicine

## 2019-03-11 ENCOUNTER — Encounter: Payer: Self-pay | Admitting: Family Medicine

## 2019-07-25 ENCOUNTER — Encounter: Payer: Self-pay | Admitting: Family Medicine

## 2019-07-25 DIAGNOSIS — E785 Hyperlipidemia, unspecified: Secondary | ICD-10-CM

## 2019-07-25 DIAGNOSIS — K219 Gastro-esophageal reflux disease without esophagitis: Secondary | ICD-10-CM

## 2019-07-30 ENCOUNTER — Encounter: Payer: Self-pay | Admitting: Family Medicine

## 2019-07-30 ENCOUNTER — Ambulatory Visit (INDEPENDENT_AMBULATORY_CARE_PROVIDER_SITE_OTHER): Payer: 59 | Admitting: Family Medicine

## 2019-07-30 ENCOUNTER — Other Ambulatory Visit: Payer: Self-pay

## 2019-07-30 VITALS — BP 108/71 | HR 60 | Temp 97.9°F | Wt 164.0 lb

## 2019-07-30 DIAGNOSIS — R1310 Dysphagia, unspecified: Secondary | ICD-10-CM

## 2019-07-30 DIAGNOSIS — Z6826 Body mass index (BMI) 26.0-26.9, adult: Secondary | ICD-10-CM

## 2019-07-30 DIAGNOSIS — R221 Localized swelling, mass and lump, neck: Secondary | ICD-10-CM

## 2019-07-30 DIAGNOSIS — M542 Cervicalgia: Secondary | ICD-10-CM

## 2019-07-30 DIAGNOSIS — Z0000 Encounter for general adult medical examination without abnormal findings: Secondary | ICD-10-CM

## 2019-07-30 DIAGNOSIS — K581 Irritable bowel syndrome with constipation: Secondary | ICD-10-CM

## 2019-07-30 MED ORDER — LUBIPROSTONE 24 MCG PO CAPS: ORAL_CAPSULE | ORAL | 2 refills | Status: DC

## 2019-07-30 MED ORDER — OMEPRAZOLE 40 MG PO CPDR: DELAYED_RELEASE_CAPSULE | ORAL | 1 refills | Status: DC

## 2019-07-30 MED ORDER — CELECOXIB 200 MG PO CAPS: ORAL_CAPSULE | ORAL | 2 refills | Status: DC

## 2019-07-30 NOTE — Patient Instructions (Addendum)
Thank you for coming in today.  Get the ultrasound of the soft tissue of the neck soon.  You should hear from [facility] soon. Let me know if you do not hear anything.   Please get mammogram and pap smear soon.   Continue healthy lifestyle.   Pt should help some.   Ok to use toe spacer for left foot mild bunion.    Preventive Care 22-53 Years Old, Female Preventive care refers to visits with your health care provider and lifestyle choices that can promote health and wellness. This includes:  A yearly physical exam. This may also be called an annual well check.  Regular dental visits and eye exams.  Immunizations.  Screening for certain conditions.  Healthy lifestyle choices, such as eating a healthy diet, getting regular exercise, not using drugs or products that contain nicotine and tobacco, and limiting alcohol use. What can I expect for my preventive care visit? Physical exam Your health care provider will check your:  Height and weight. This may be used to calculate body mass index (BMI), which tells if you are at a healthy weight.  Heart rate and blood pressure.  Skin for abnormal spots. Counseling Your health care provider may ask you questions about your:  Alcohol, tobacco, and drug use.  Emotional well-being.  Home and relationship well-being.  Sexual activity.  Eating habits.  Work and work Statistician.  Method of birth control.  Menstrual cycle.  Pregnancy history. What immunizations do I need?  Influenza (flu) vaccine  This is recommended every year. Tetanus, diphtheria, and pertussis (Tdap) vaccine  You may need a Td booster every 10 years. Varicella (chickenpox) vaccine  You may need this if you have not been vaccinated. Zoster (shingles) vaccine  You may need this after age 66. Measles, mumps, and rubella (MMR) vaccine  You may need at least one dose of MMR if you were born in 1957 or later. You may also need a second  dose. Pneumococcal conjugate (PCV13) vaccine  You may need this if you have certain conditions and were not previously vaccinated. Pneumococcal polysaccharide (PPSV23) vaccine  You may need one or two doses if you smoke cigarettes or if you have certain conditions. Meningococcal conjugate (MenACWY) vaccine  You may need this if you have certain conditions. Hepatitis A vaccine  You may need this if you have certain conditions or if you travel or work in places where you may be exposed to hepatitis A. Hepatitis B vaccine  You may need this if you have certain conditions or if you travel or work in places where you may be exposed to hepatitis B. Haemophilus influenzae type b (Hib) vaccine  You may need this if you have certain conditions. Human papillomavirus (HPV) vaccine  If recommended by your health care provider, you may need three doses over 6 months. You may receive vaccines as individual doses or as more than one vaccine together in one shot (combination vaccines). Talk with your health care provider about the risks and benefits of combination vaccines. What tests do I need? Blood tests  Lipid and cholesterol levels. These may be checked every 5 years, or more frequently if you are over 67 years old.  Hepatitis C test.  Hepatitis B test. Screening  Lung cancer screening. You may have this screening every year starting at age 49 if you have a 30-pack-year history of smoking and currently smoke or have quit within the past 15 years.  Colorectal cancer screening. All adults should  have this screening starting at age 61 and continuing until age 39. Your health care provider may recommend screening at age 6 if you are at increased risk. You will have tests every 1-10 years, depending on your results and the type of screening test.  Diabetes screening. This is done by checking your blood sugar (glucose) after you have not eaten for a while (fasting). You may have this done every  1-3 years.  Mammogram. This may be done every 1-2 years. Talk with your health care provider about when you should start having regular mammograms. This may depend on whether you have a family history of breast cancer.  BRCA-related cancer screening. This may be done if you have a family history of breast, ovarian, tubal, or peritoneal cancers.  Pelvic exam and Pap test. This may be done every 3 years starting at age 36. Starting at age 108, this may be done every 5 years if you have a Pap test in combination with an HPV test. Other tests  Sexually transmitted disease (STD) testing.  Bone density scan. This is done to screen for osteoporosis. You may have this scan if you are at high risk for osteoporosis. Follow these instructions at home: Eating and drinking  Eat a diet that includes fresh fruits and vegetables, whole grains, lean protein, and low-fat dairy.  Take vitamin and mineral supplements as recommended by your health care provider.  Do not drink alcohol if: ? Your health care provider tells you not to drink. ? You are pregnant, may be pregnant, or are planning to become pregnant.  If you drink alcohol: ? Limit how much you have to 0-1 drink a day. ? Be aware of how much alcohol is in your drink. In the [place] one drink equals one 12 oz bottle of beer (355 mL), one 5 oz glass of wine (148 mL), or one 1 oz glass of hard liquor (44 mL). Lifestyle  Take daily care of your teeth and gums.  Stay active. Exercise for at least 30 minutes on 5 or more days each week.  Do not use any products that contain nicotine or tobacco, such as cigarettes, e-cigarettes, and chewing tobacco. If you need help quitting, ask your health care provider.  If you are sexually active, practice safe sex. Use a condom or other form of birth control (contraception) in order to prevent pregnancy and STIs (sexually transmitted infections).  If told by your health care provider, take low-dose aspirin daily  starting at age 52. What's next?  Visit your health care provider once a year for a well check visit.  Ask your health care provider how often you should have your eyes and teeth checked.  Stay up to date on all vaccines. This information is not intended to replace advice given to you by your health care provider. Make sure you discuss any questions you have with your health care provider. Document Released: 01/02/2016 Document Revised: 08/17/2018 Document Reviewed: 08/17/2018 Elsevier Patient Education  2020 [employer].

## 2019-07-30 NOTE — Progress Notes (Signed)
Betty Lucas is a 53 y.o. female who presents to [facility] [place] [facility] today for well adult visit.   Betty Lucas is doing reasonably well overall.  She has OB/GYN where she gets her breast and cervical cancer screening and is scheduled for mammogram and Pap smear in the near future.  However she notes that she is having some difficulty.  In March she was seen for mild dysphasia and thought to have perhaps globus sensation.  She was treated with Flonase nasal spray Astelin nasal spray and omeprazole.  She notes this has not helped.  She feels as though there is something getting stuck in her throat at times.  Sometimes she notes that she feels the food may get stuck as well.  She is able to pass liquids much more easily than solids.  She had EGD about 2 years ago that was normal.  Additionally she notes some pain and swelling in the right lateral neck along the sternocleidomastoid.  She notes she has been doing some massage therapy which helps a little.  She notes some pain and tenderness with neck motion.  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  She feels well otherwise with no fevers chills nausea vomiting diarrhea feeling too hot feeling too cold or unexplained weight loss.  She exercises regularly and tries eat a healthy diet.   ROS as above:  Past Medical History:  Diagnosis Date  . Actinic keratosis 04/17/2012  . History of deep vein thrombosis (DVT) of lower extremity 10/01/2014   Rt after fracture around 2011  . IBS (irritable bowel syndrome) 06/06/2017   Past Surgical History:  Procedure Laterality Date  . BREAST ENHANCEMENT SURGERY    . FOOT SURGERY    . OVARIAN CYST SURGERY     Social History   Tobacco Use  . Smoking status: Never Smoker  . Smokeless tobacco: Never Used  Substance Use Topics  . Alcohol use: Yes   family history includes  Cancer in her father and paternal aunt; Hyperlipidemia in her mother.  Medications: Current Outpatient Medications  Medication Sig Dispense Refill  . azelastine (ASTELIN) 0.1 % nasal spray Place 2 sprays into both nostrils 2 (two) times daily. Use in each nostril as directed 30 mL 12  . celecoxib (CELEBREX) 200 MG capsule One to 2 tablets by mouth daily as needed for pain. This replaces Aleve 60 capsule 2  . lubiprostone (AMITIZA) 24 MCG capsule TAKE 1 CAPSULE BY MOUTH 2 (TWO) TIMES DAILY WITH A MEAL. 180 capsule 2  . omeprazole (PRILOSEC) 40 MG capsule TAKE 1 CAPSULE BY MOUTH EVERY DAY 90 capsule 1  . Probiotic Product (PROBIOTIC DAILY PO) Take by mouth.    . triamcinolone (KENALOG) 0.1 % paste Use as directed 1 application in the mouth or throat 2 (two) times daily. For canker sores until resolved. 5 g 11  . valACYclovir (VALTREX) 1000 MG tablet Take 2 tablets (2,000 mg total) by mouth 2 (two) times daily. At start of cold sore symptoms 4 tablet 2   No current facility-administered medications for this visit.    No Known Allergies  Health Maintenance Health Maintenance  Topic Date Due  . INFLUENZA VACCINE  07/21/2019  . PAP SMEAR-Modifier  09/05/2019  . MAMMOGRAM  06/05/2020  . TETANUS/TDAP  12/07/2025  . COLONOSCOPY  09/07/2027  . HIV Screening  Completed     Exam:  BP 108/71   Pulse  60   Temp 97.9 F (36.6 C) (Oral)   Wt 164 lb (74.4 kg)   BMI 26.47 kg/m  Wt Readings from Last 5 Encounters:  07/30/19 164 lb (74.4 kg)  02/27/19 166 lb (75.3 kg)  10/18/18 162 lb (73.5 kg)  07/04/17 154 lb (69.9 kg)  06/06/17 148 lb (67.1 kg)      Gen: Well NAD HEENT: EOMI,  MMM right sternocleidomastoid slightly full.  No palpable masses or nodule.  Normal neck motion. Lungs: Normal work of breathing. CTABL Heart: RRR no MRG Abd: NABS, Soft. Nondistended, Nontender Exts: Brisk capillary refill, warm and well perfused.  Psych: Alert and oriented normal speech thought process and  affect.  Depression screen [facility] 2/9 07/30/2019 10/18/2018 10/18/2018 07/04/2017 06/06/2017  Decreased Interest 0 0 0 0 0  Down, Depressed, Hopeless 0 1 0 0 1  PHQ - 2 Score 0 1 0 0 1  Altered sleeping 0 2 - - -  Tired, decreased energy 1 0 - - -  Change in appetite 0 0 - - -  Feeling bad or failure about yourself  0 0 - - -  Trouble concentrating 0 1 - - -  Moving slowly or fidgety/restless 0 - - - -  Suicidal thoughts 0 0 - - -  PHQ-9 Score 1 4 - - -  Difficult doing work/chores Not difficult at all Not difficult at all - - -       Lab and Radiology Results Report of colonoscopy and EGD from 2018 reviewed.  MRI 2015 from [facility] reviewed.    Assessment and Plan: 53 y.o. female with  Well adult.  Doing reasonably well.  Patient effectively has up-to-date health maintenance.  Plan for flu vaccine this fall and making sure she gets her mammogram and Pap smear at OB/GYN.  Likely will get bone density test next year.  Continue healthy lifestyle with exercise and healthy diet.  As for her dysphasia initially was concern for globus sensation however now with sensation that food is getting stuck I think it is reasonable to recheck back with gastroenterology.  She had normal EGD 2 years ago but may benefit repeat EGD.  If no benefit from GI neck step would likely be ENT referral.  Additionally she has some fullness in her right sternocleidomastoid.  I cannot palpate any obvious nodules however reasonable to obtain soft tissue ultrasound neck to rule out obvious masses.  I think the most likely etiology is muscle dysfunction.  We will proceed with physical therapy referral.  Recheck yearly or sooner if needed. PDMP not reviewed this encounter. Orders Placed This Encounter  Procedures  . US Soft Tissue Head/Neck    Standing Status:   Future    Standing Expiration Date:   09/28/2020    Order Specific Question:   Reason for Exam (SYMPTOM  OR DIAGNOSIS REQUIRED)    Answer:   right  neck swelling    Order Specific Question:   Preferred imaging location?    Answer:   [facility]  . Ambulatory referral to Gastroenterology    Referral Priority:   Routine    Referral Type:   Consultation    Referral Reason:   Specialty Services Required    Number of Visits Requested:   1  . Ambulatory referral to Physical Therapy    Referral Priority:   Routine    Referral Type:   Physical Medicine    Referral Reason:   Specialty Services Required    Requested  Specialty:   Physical Therapy   Meds ordered this encounter  Medications  . celecoxib (CELEBREX) 200 MG capsule    Sig: One to 2 tablets by mouth daily as needed for pain. This replaces Aleve    Dispense:  60 capsule    Refill:  2  . lubiprostone (AMITIZA) 24 MCG capsule    Sig: TAKE 1 CAPSULE BY MOUTH 2 (TWO) TIMES DAILY WITH A MEAL.    Dispense:  180 capsule    Refill:  2  . omeprazole (PRILOSEC) 40 MG capsule    Sig: TAKE 1 CAPSULE BY MOUTH EVERY DAY    Dispense:  90 capsule    Refill:  1     Discussed warning signs or symptoms. Please see discharge instructions. Patient expresses understanding.

## 2019-07-31 LAB — COMPLETE METABOLIC PANEL WITH GFR
AG Ratio: 2.1 (calc) (ref 1.0–2.5)
ALT: 11 U/L (ref 6–29)
AST: 14 U/L (ref 10–35)
Albumin: 4.6 g/dL (ref 3.6–5.1)
Alkaline phosphatase (APISO): 52 U/L (ref 37–153)
BUN: 16 mg/dL (ref 7–25)
CO2: 30 mmol/L (ref 20–32)
Calcium: 9.6 mg/dL (ref 8.6–10.4)
Chloride: 105 mmol/L (ref 98–110)
Creat: 0.84 mg/dL (ref 0.50–1.05)
GFR, Est African American: 92 mL/min/{1.73_m2} (ref >=60)
GFR, Est Non African American: 79 mL/min/{1.73_m2} (ref >=60)
Globulin: 2.2 g/dL (calc) (ref 1.9–3.7)
Glucose, Bld: 81 mg/dL (ref 65–99)
Potassium: 4.6 mmol/L (ref 3.5–5.3)
Sodium: 140 mmol/L (ref 135–146)
Total Bilirubin: 0.6 mg/dL (ref 0.2–1.2)
Total Protein: 6.8 g/dL (ref 6.1–8.1)

## 2019-07-31 LAB — LIPID PANEL W/REFLEX DIRECT LDL
Cholesterol: 213 mg/dL — ABNORMAL HIGH (ref <200)
HDL: 69 mg/dL (ref >=50)
LDL Cholesterol (Calc): 129 mg/dL (calc) — ABNORMAL HIGH
Non-HDL Cholesterol (Calc): 144 mg/dL (calc) — ABNORMAL HIGH (ref <130)
Total CHOL/HDL Ratio: 3.1 (calc) (ref <5.0)
Triglycerides: 49 mg/dL (ref <150)

## 2019-07-31 LAB — CBC
HCT: 39.3 % (ref 35.0–45.0)
Hemoglobin: 12.9 g/dL (ref 11.7–15.5)
MCH: 30.7 pg (ref 27.0–33.0)
MCHC: 32.8 g/dL (ref 32.0–36.0)
MCV: 93.6 fL (ref 80.0–100.0)
MPV: 9.4 fL (ref 7.5–12.5)
Platelets: 284 10*3/uL (ref 140–400)
RBC: 4.2 10*6/uL (ref 3.80–5.10)
RDW: 11.9 % (ref 11.0–15.0)
WBC: 4.4 10*3/uL (ref 3.8–10.8)

## 2019-08-01 ENCOUNTER — Other Ambulatory Visit: Payer: Self-pay

## 2019-08-01 ENCOUNTER — Ambulatory Visit (INDEPENDENT_AMBULATORY_CARE_PROVIDER_SITE_OTHER): Payer: 59

## 2019-08-01 DIAGNOSIS — R221 Localized swelling, mass and lump, neck: Secondary | ICD-10-CM | POA: Diagnosis not present

## 2019-08-01 IMAGING — US SOFT TISSUE ULTRASOUND HEAD/NECK
1 series · 14 of 25 positions shown · non-contrast
Comparison: None.

CLINICAL DATA: Right neck swelling.

EXAM:
ULTRASOUND OF HEAD/NECK SOFT TISSUES
TECHNIQUE: Ultrasound examination of the head and neck soft tissues was
performed in the area of clinical concern.

[Series 1: soft tissue ultrasound head/neck · 0.06mm/px · 14 of 27 slices shown]
[im 1/27]
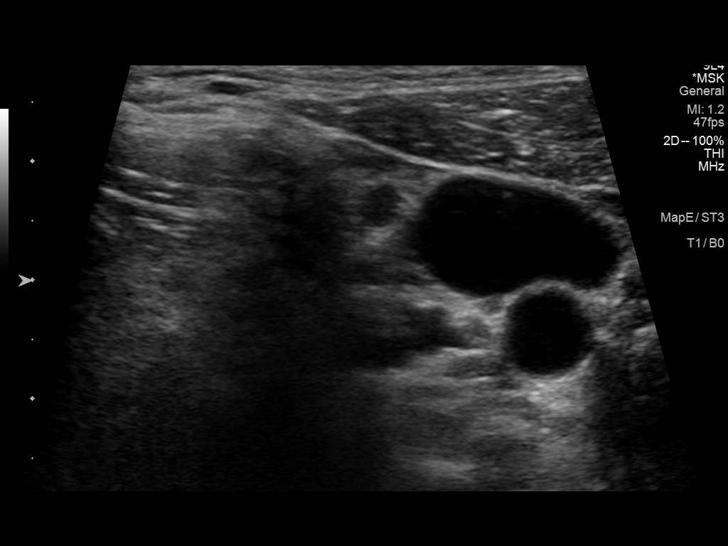
[im 3/27]
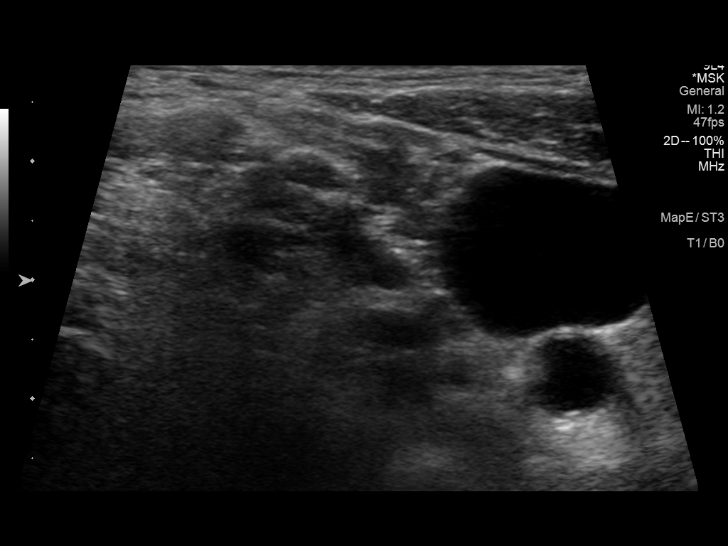
[im 5/27]
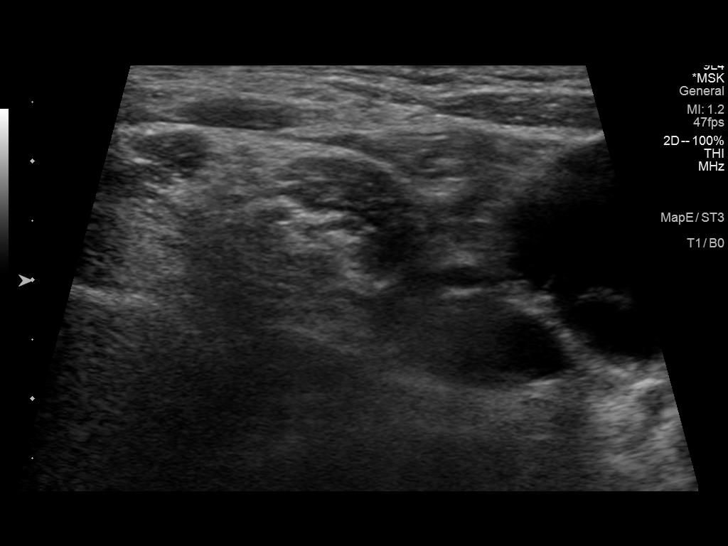
[im 7/27]
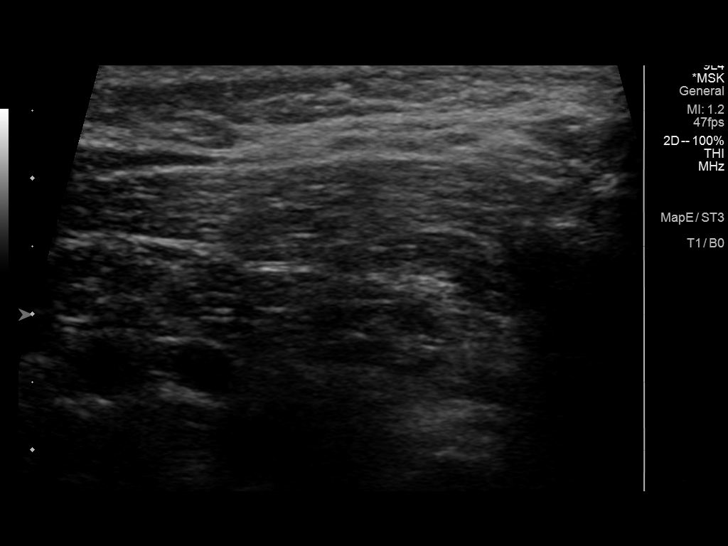
[im 9/27]
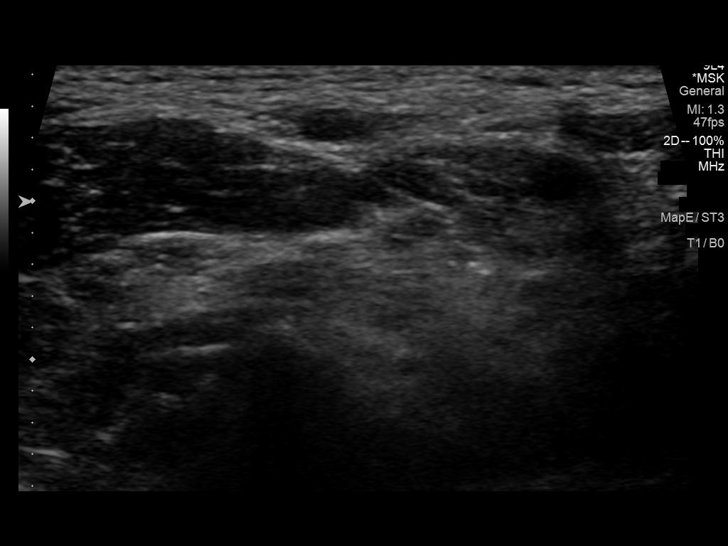
[im 10/27]
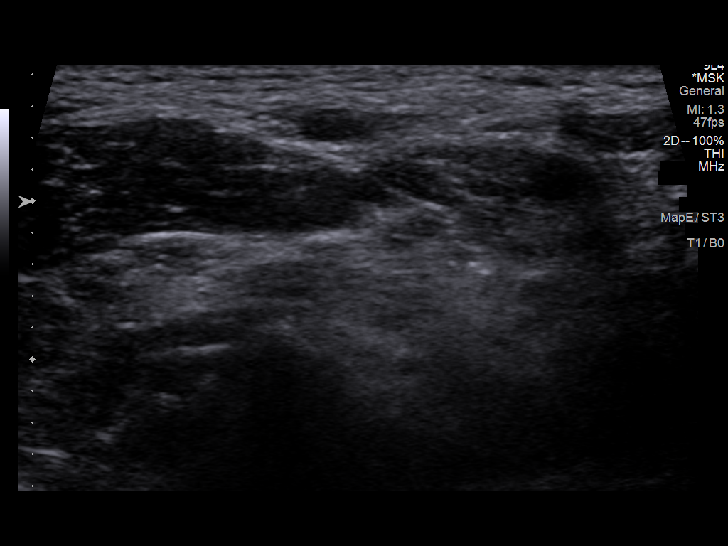
[im 12/27]
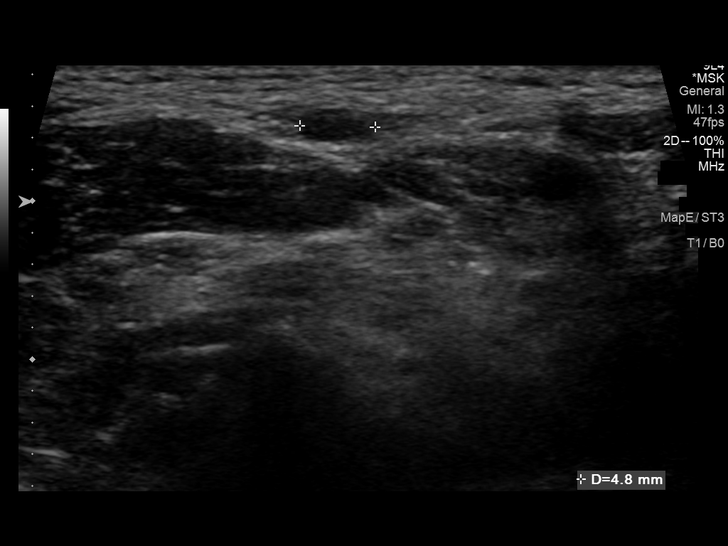
[im 15/27]
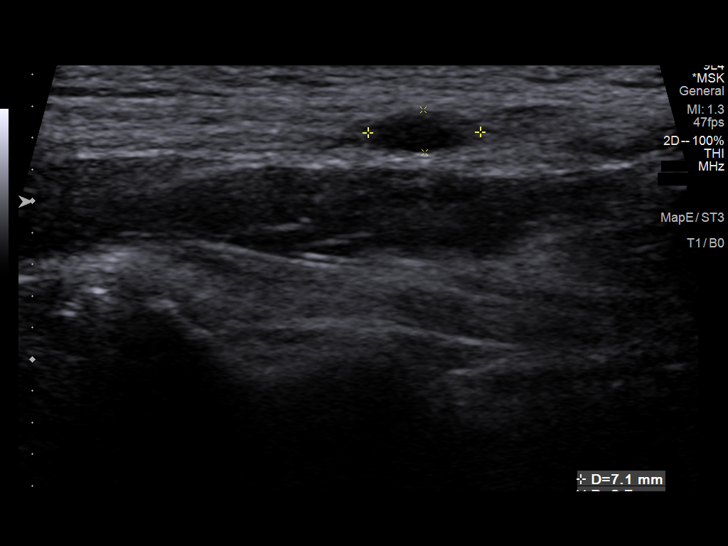
[im 17/27]
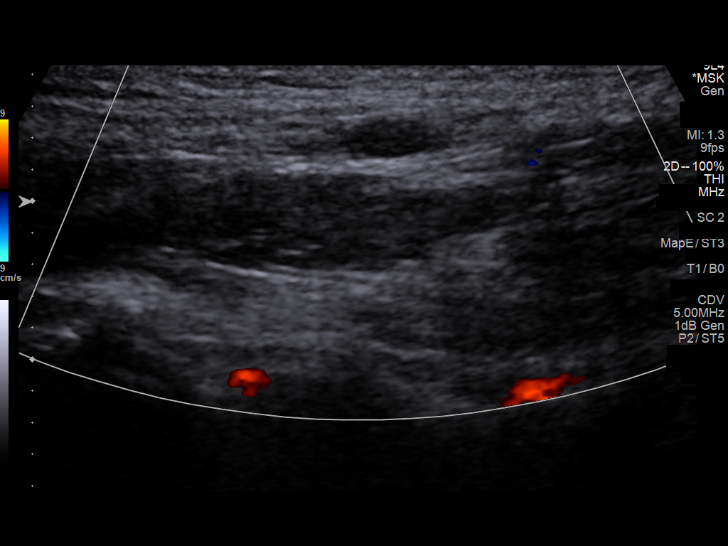
[im 18/27]
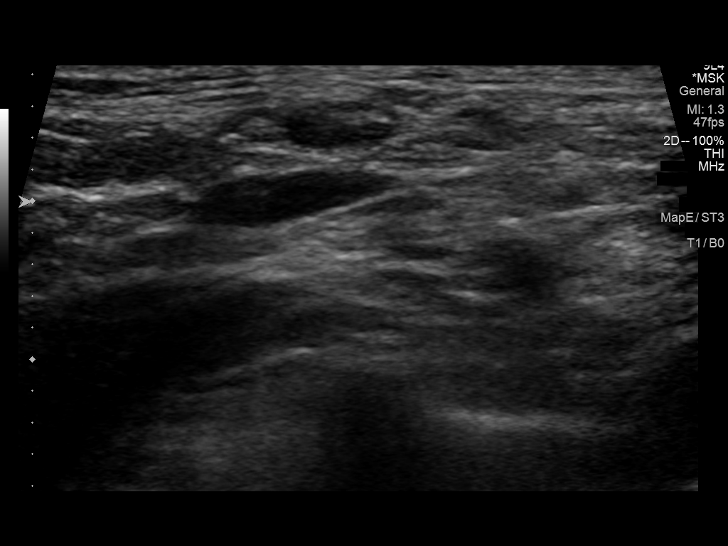
[im 20/27]
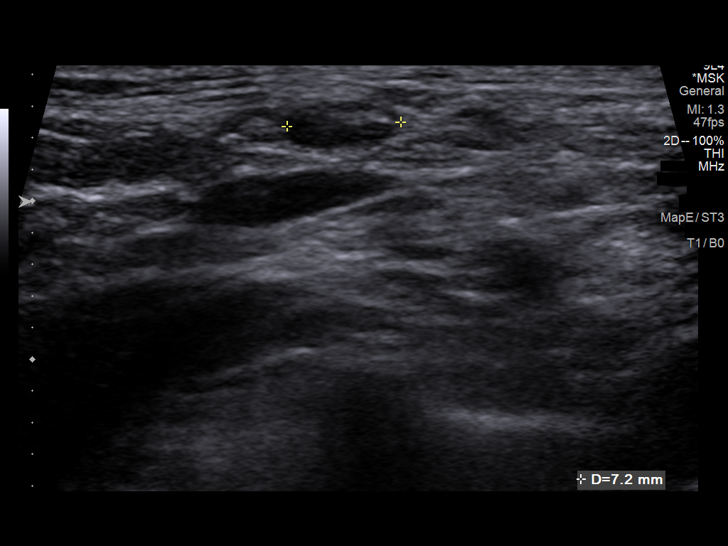
[im 22/27]
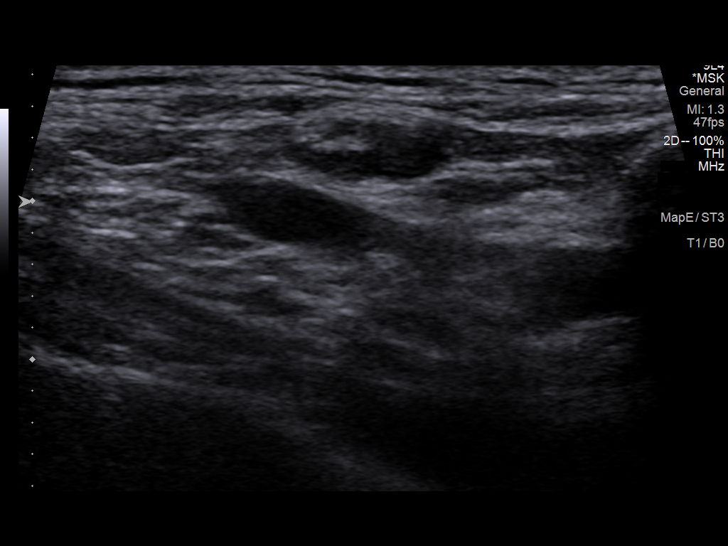
[im 24/27]
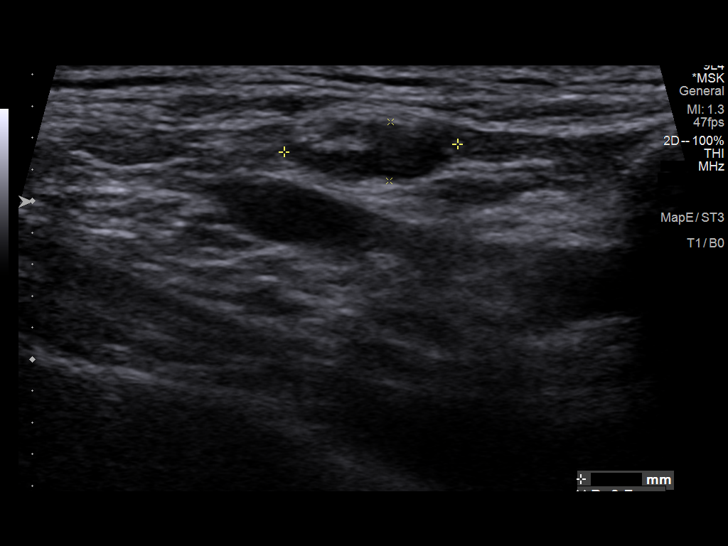
[im 27/27]
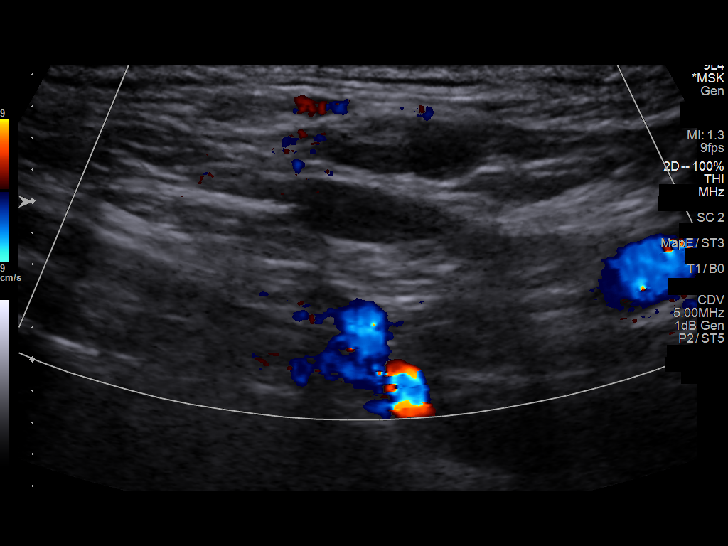

[14 of 25 positions shown; findings below may reference images not displayed]

FINDINGS: Targeted ultrasound of the right lateral neck in the area of
clinical concern demonstrates an 11 x 4 x 7 mm mass with and is
centric region of echogenicity likely reflecting the fatty hilum of
a lymph node. A second smaller hypoechoic nodule measures 7 x 3 x 5
mm without an appreciable fatty hilum or other distinctive internal
architecture. There are no cystic changes.
IMPRESSION: Two subcentimeter short axis nodules in the right neck most likely
reflecting small lymph nodes, of indeterminate etiology though
without any highly suspicious findings.

## 2019-08-06 ENCOUNTER — Other Ambulatory Visit: Payer: 59

## 2019-11-03 LAB — HM MAMMOGRAPHY

## 2019-11-29 ENCOUNTER — Encounter: Payer: Self-pay | Admitting: Family Medicine

## 2020-04-07 ENCOUNTER — Ambulatory Visit (INDEPENDENT_AMBULATORY_CARE_PROVIDER_SITE_OTHER): Payer: 59 | Admitting: Sports Medicine

## 2020-04-07 ENCOUNTER — Other Ambulatory Visit: Payer: Self-pay

## 2020-04-07 ENCOUNTER — Encounter: Payer: Self-pay | Admitting: Sports Medicine

## 2020-04-07 ENCOUNTER — Ambulatory Visit (INDEPENDENT_AMBULATORY_CARE_PROVIDER_SITE_OTHER): Payer: 59

## 2020-04-07 DIAGNOSIS — M25562 Pain in left knee: Secondary | ICD-10-CM | POA: Diagnosis not present

## 2020-04-07 DIAGNOSIS — M47812 Spondylosis without myelopathy or radiculopathy, cervical region: Secondary | ICD-10-CM

## 2020-04-07 DIAGNOSIS — G8929 Other chronic pain: Secondary | ICD-10-CM

## 2020-04-07 DIAGNOSIS — M1712 Unilateral primary osteoarthritis, left knee: Secondary | ICD-10-CM | POA: Insufficient documentation

## 2020-04-07 IMAGING — DX DG CERVICAL SPINE COMPLETE 4+V
6 series · 6 of 6 positions shown · non-contrast
Comparison: None.

CLINICAL DATA: Popping in neck

EXAM:
CERVICAL SPINE - COMPLETE 4+ VIEW

[c-spine lat]
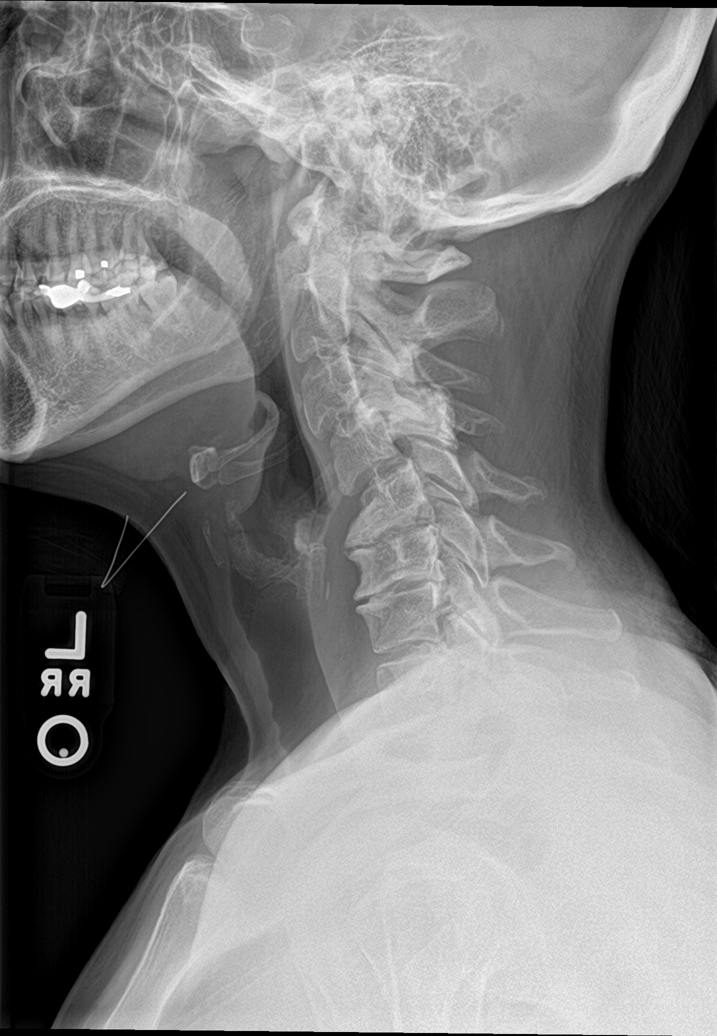

[c-spine obl (1 of 2)]
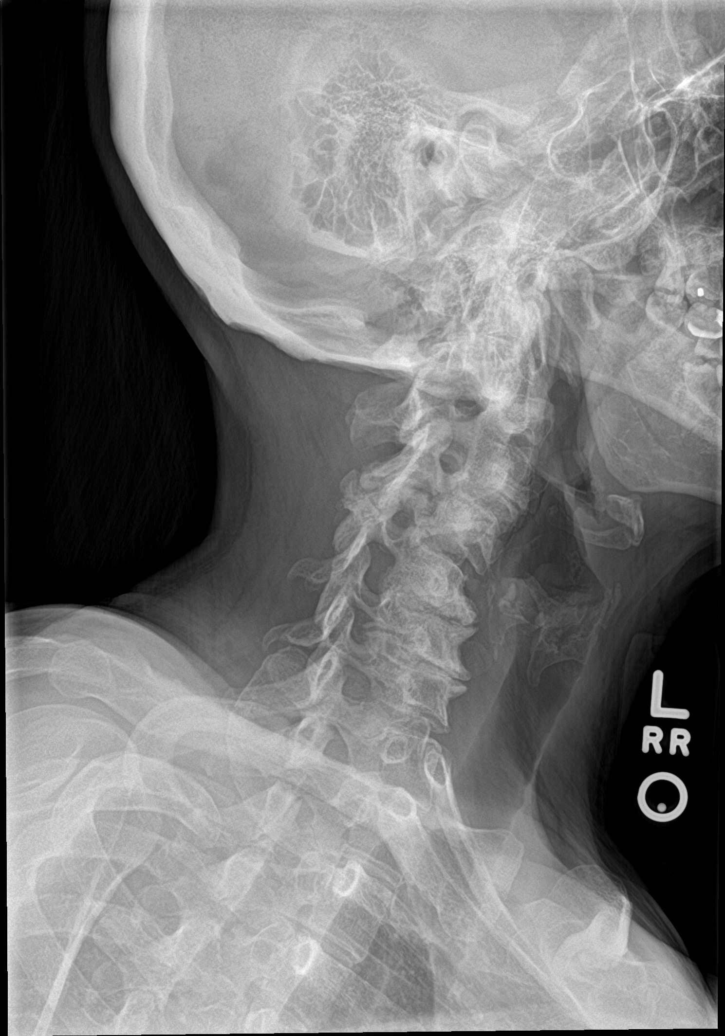

[c-spine obl (2 of 2)]
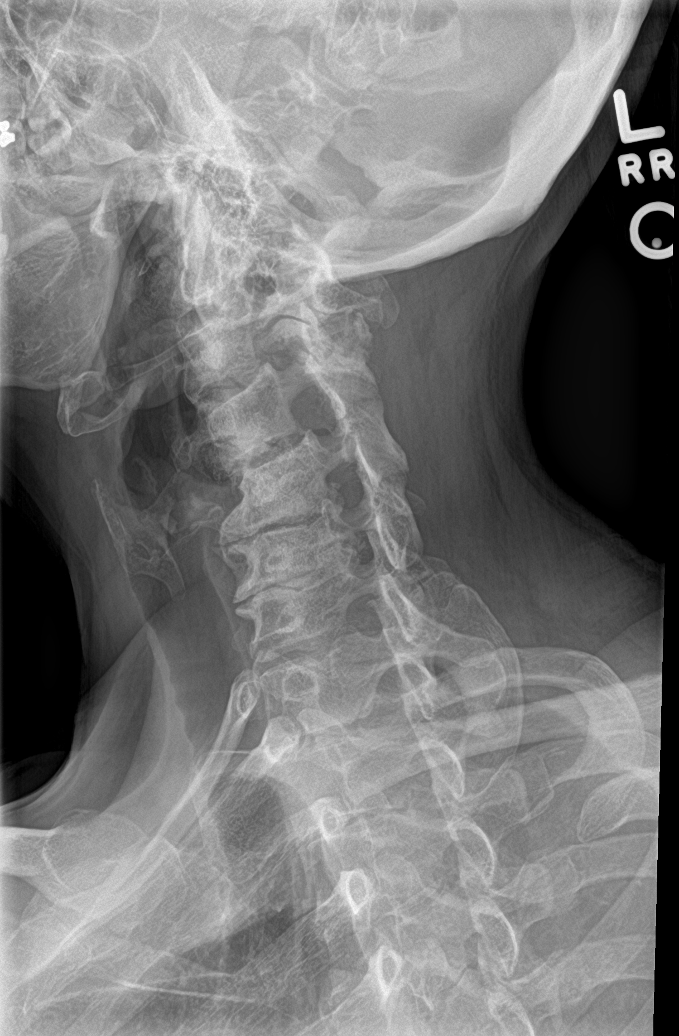

[c-spine ap]
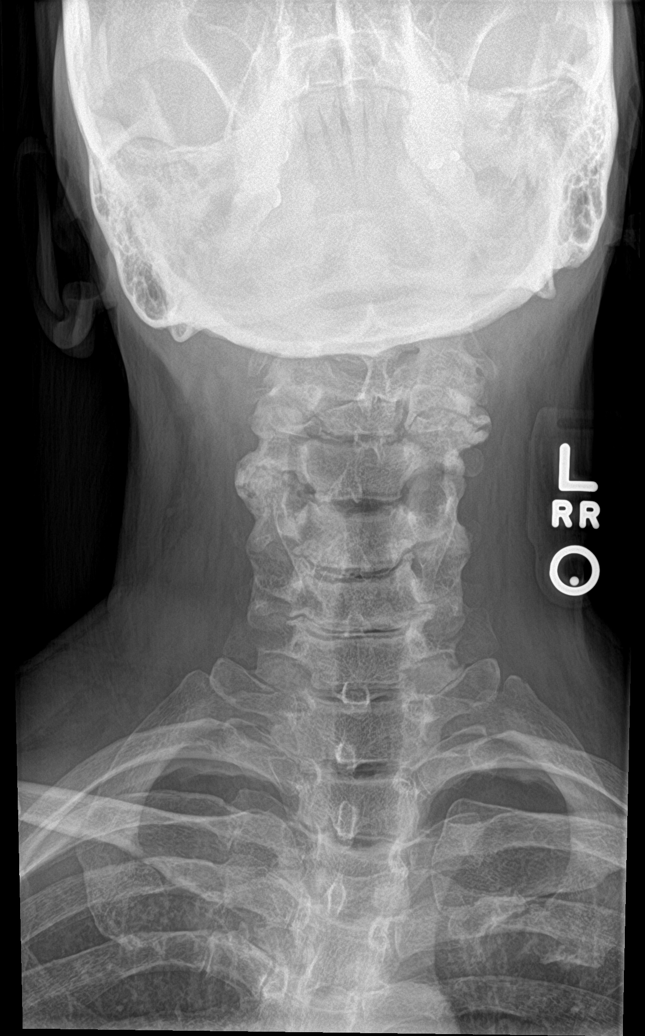

[c-spine open mouth]
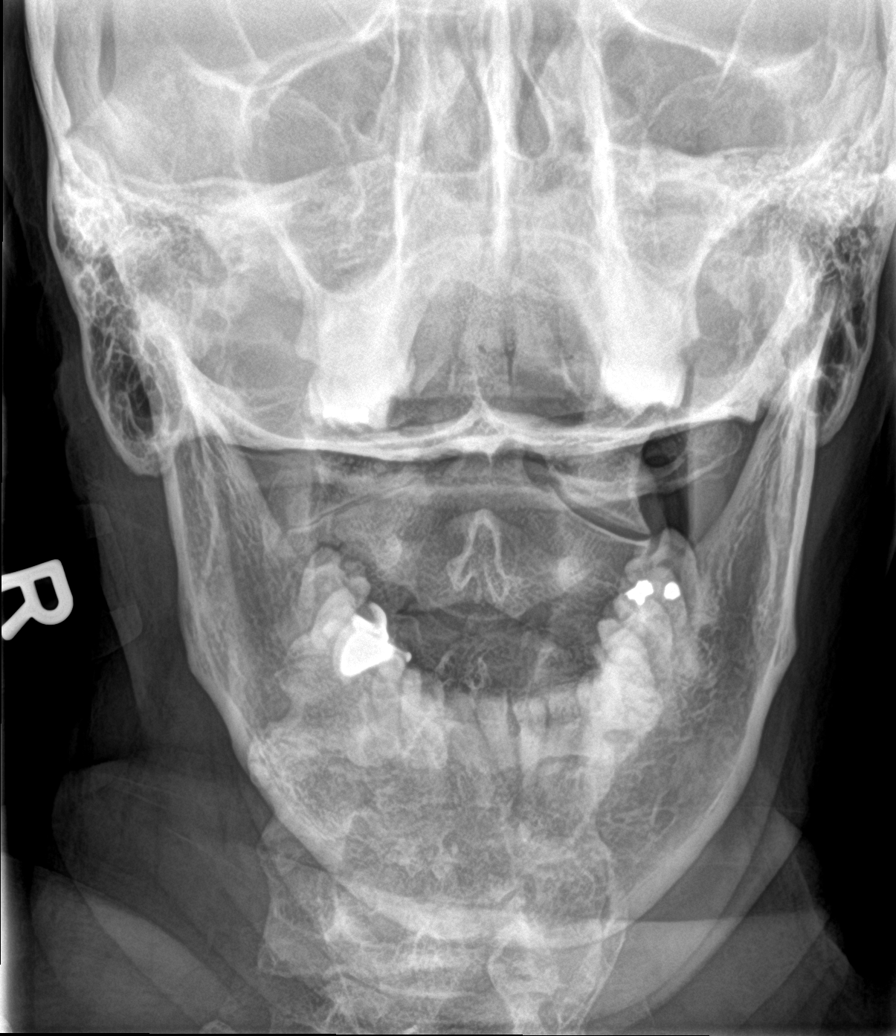

[[person_name]]
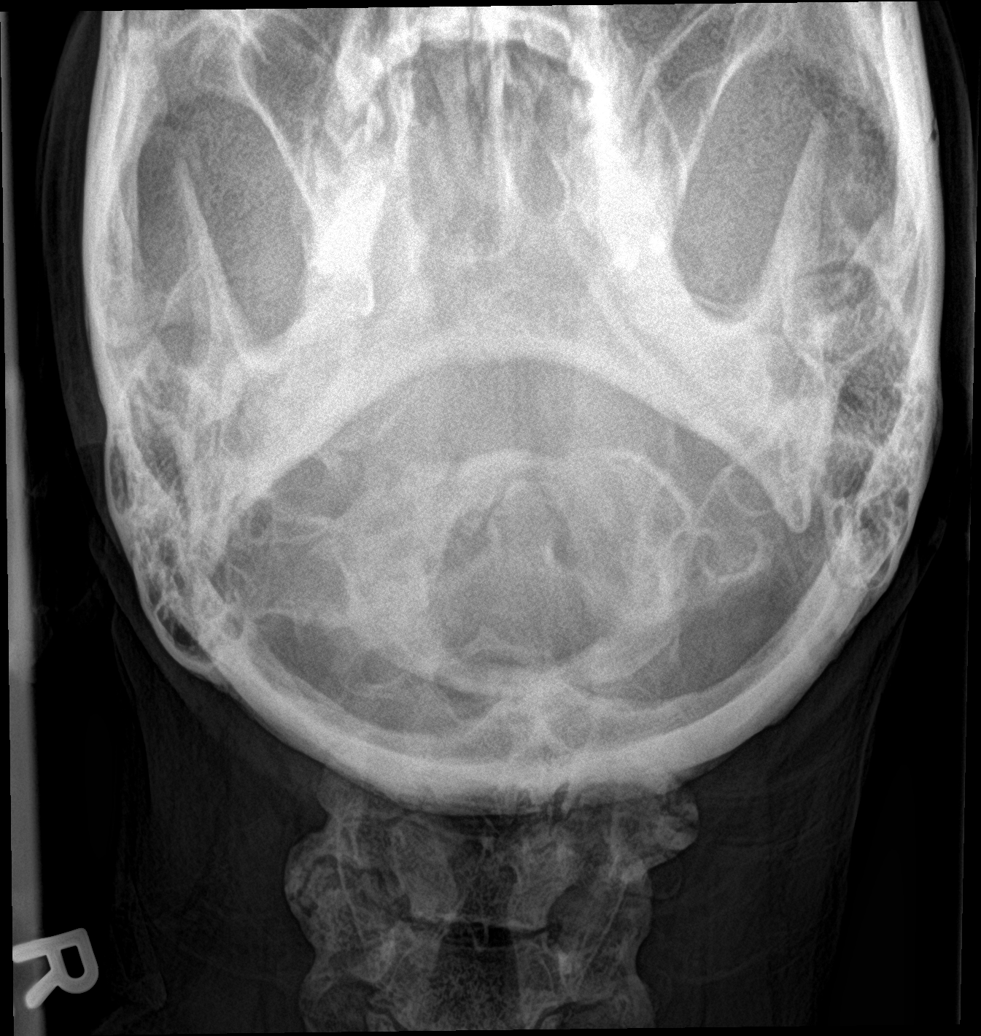

[6 of 6 positions shown; findings below may reference images not displayed]

FINDINGS: Reversal of cervical lordosis. Trace anterolisthesis C2 on C3 and C3
on C4. Lateral masses and dens are within normal limits. Moderate
degenerative change at C3-C4 with advanced degenerative change C5-C6
and C6-C7. Foraminal narrowing at multiple levels right greater than
left.
IMPRESSION: 1. Reversal of cervical lordosis with trace anterolisthesis C3 on C4
and C4 on C5 probably degenerative.
2. Diffuse degenerative changes with advanced disease at C5-C6 and
C6-C7

## 2020-04-07 IMAGING — DX DG KNEE COMPLETE 4+V*L*
4 series · 4 of 4 positions shown · non-contrast
Comparison: None.

CLINICAL DATA: Knee pain

EXAM:
LEFT KNEE - COMPLETE 4+ VIEW

[knee ap]
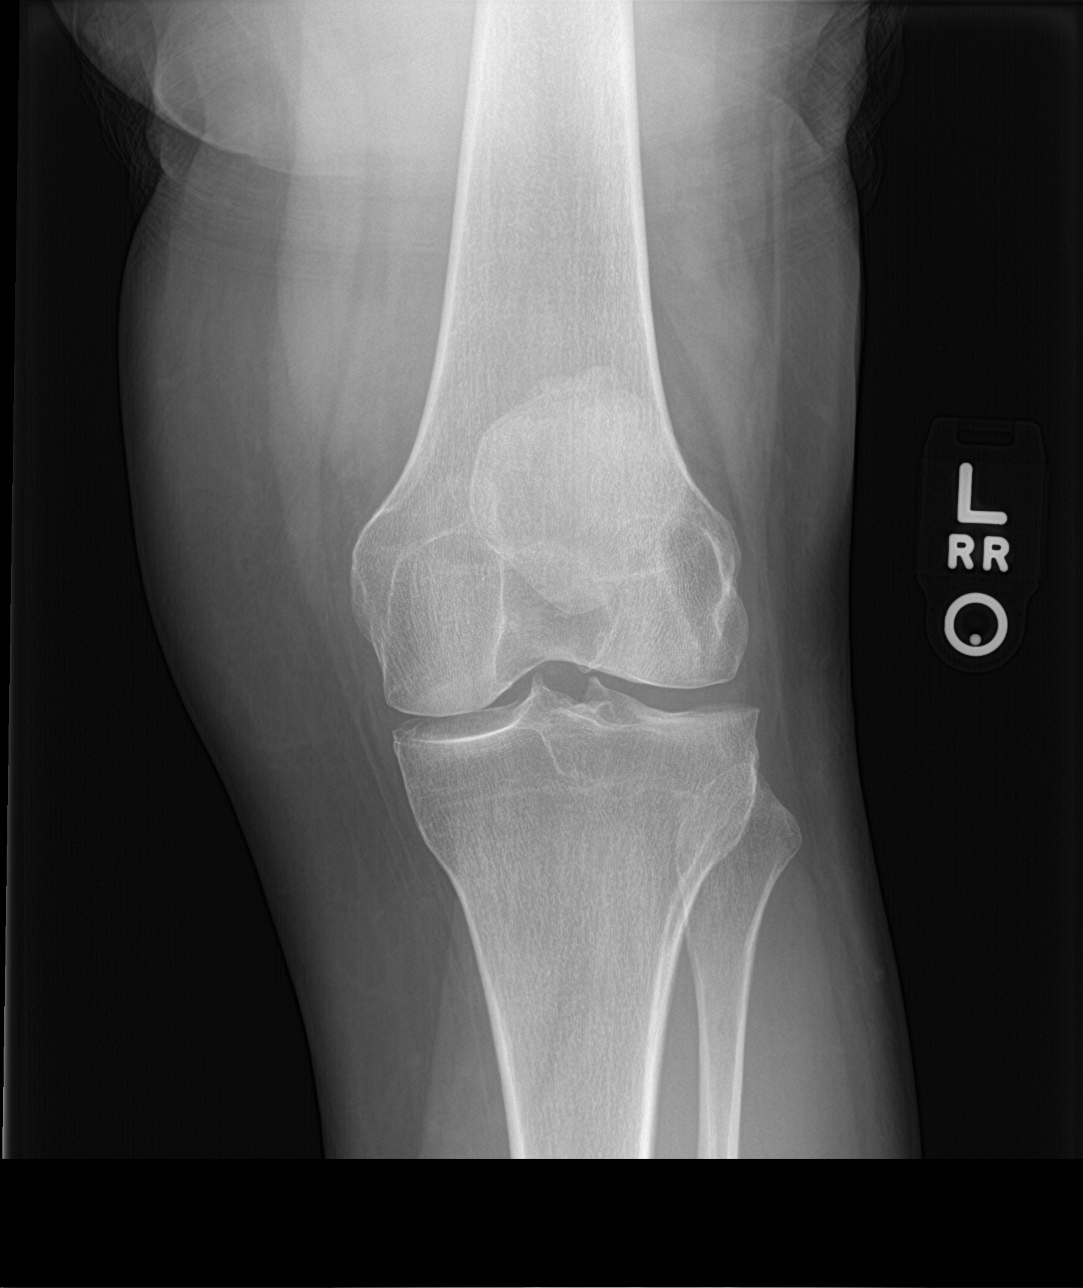

[tunnel]
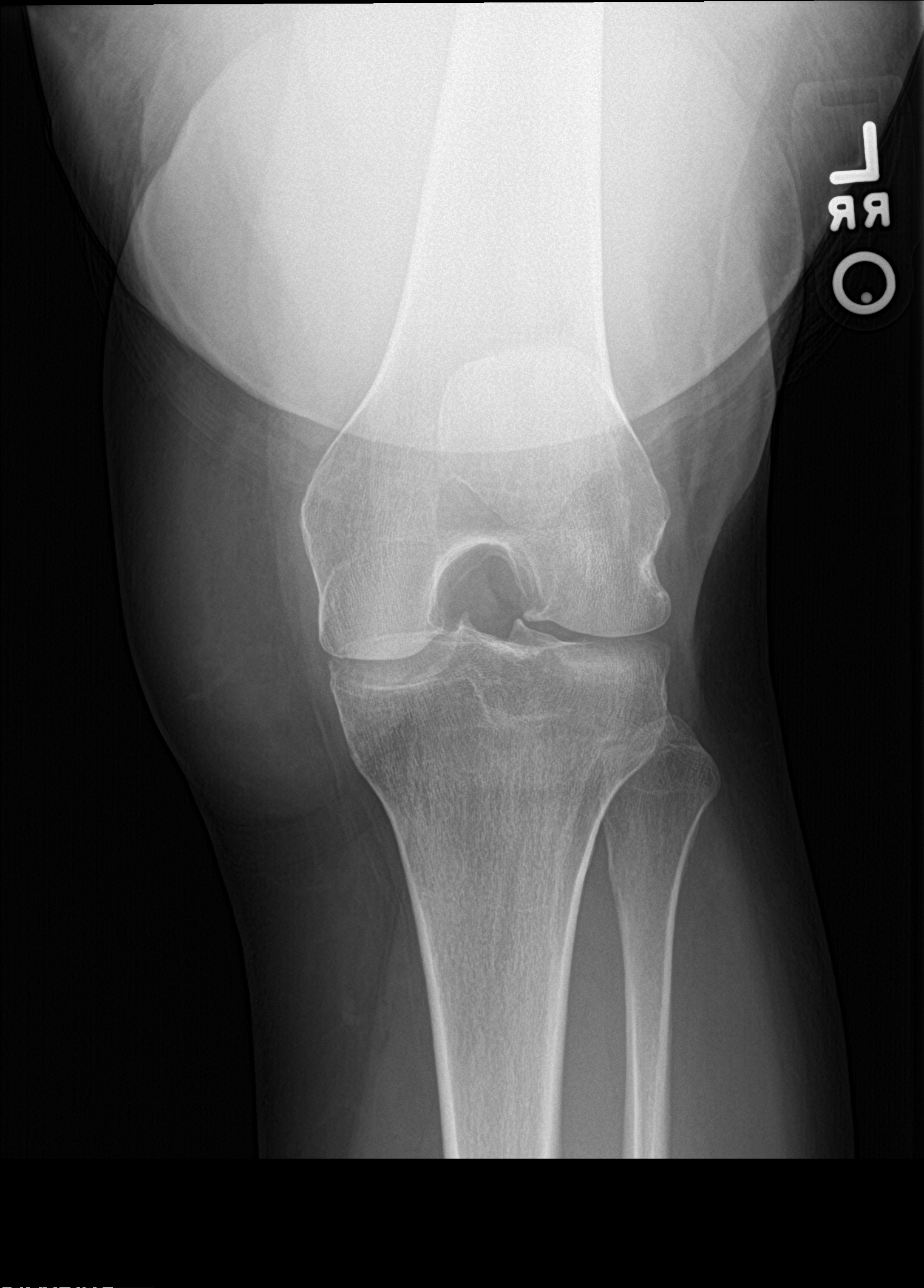

[knee lat]
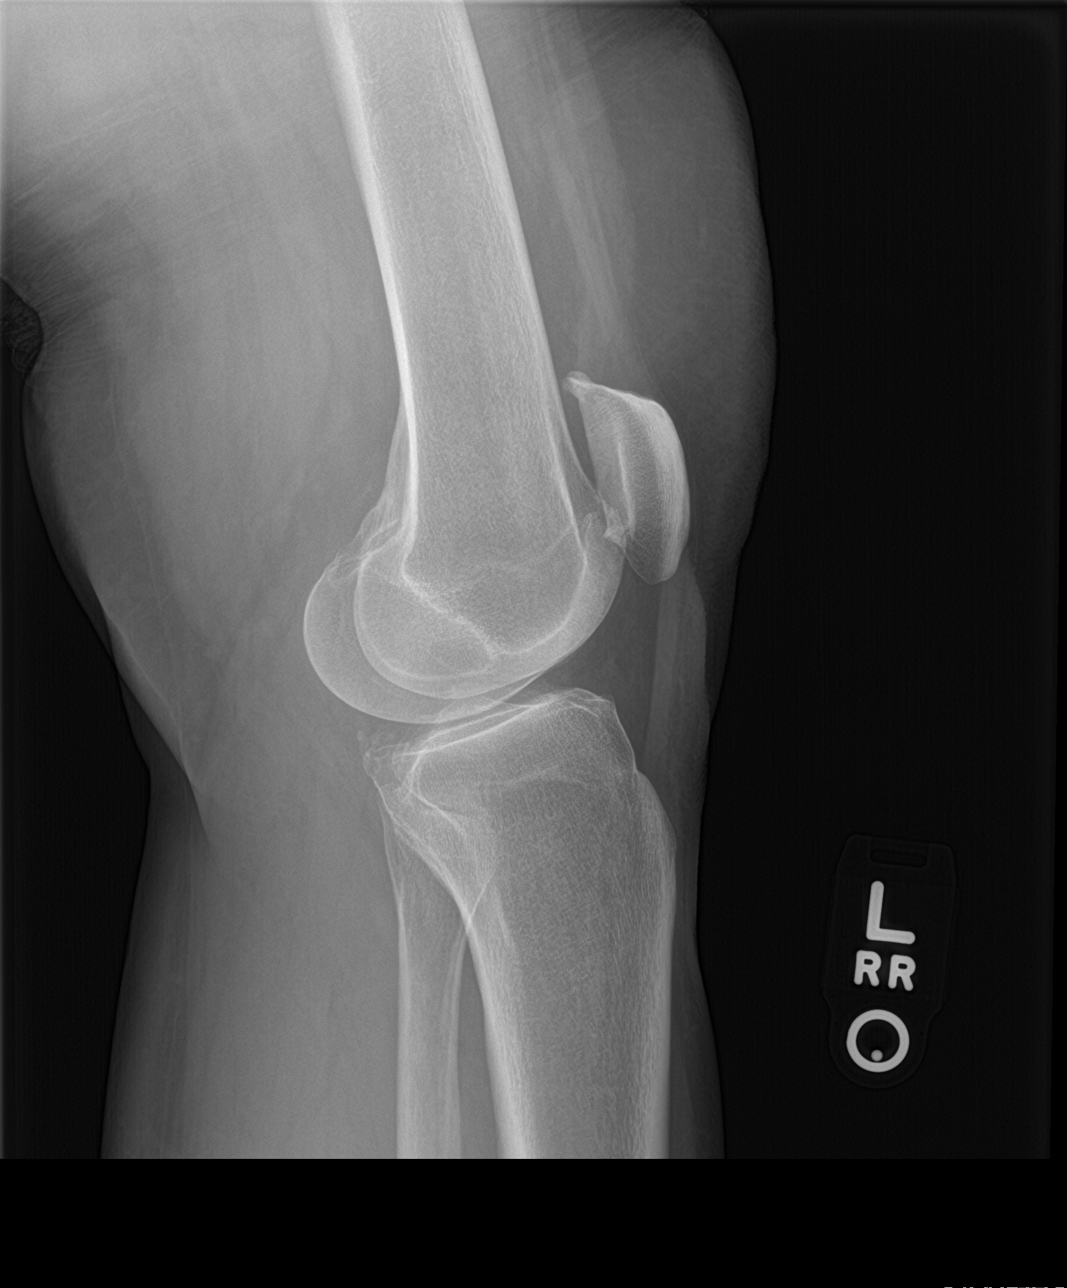

[knee sunrise]
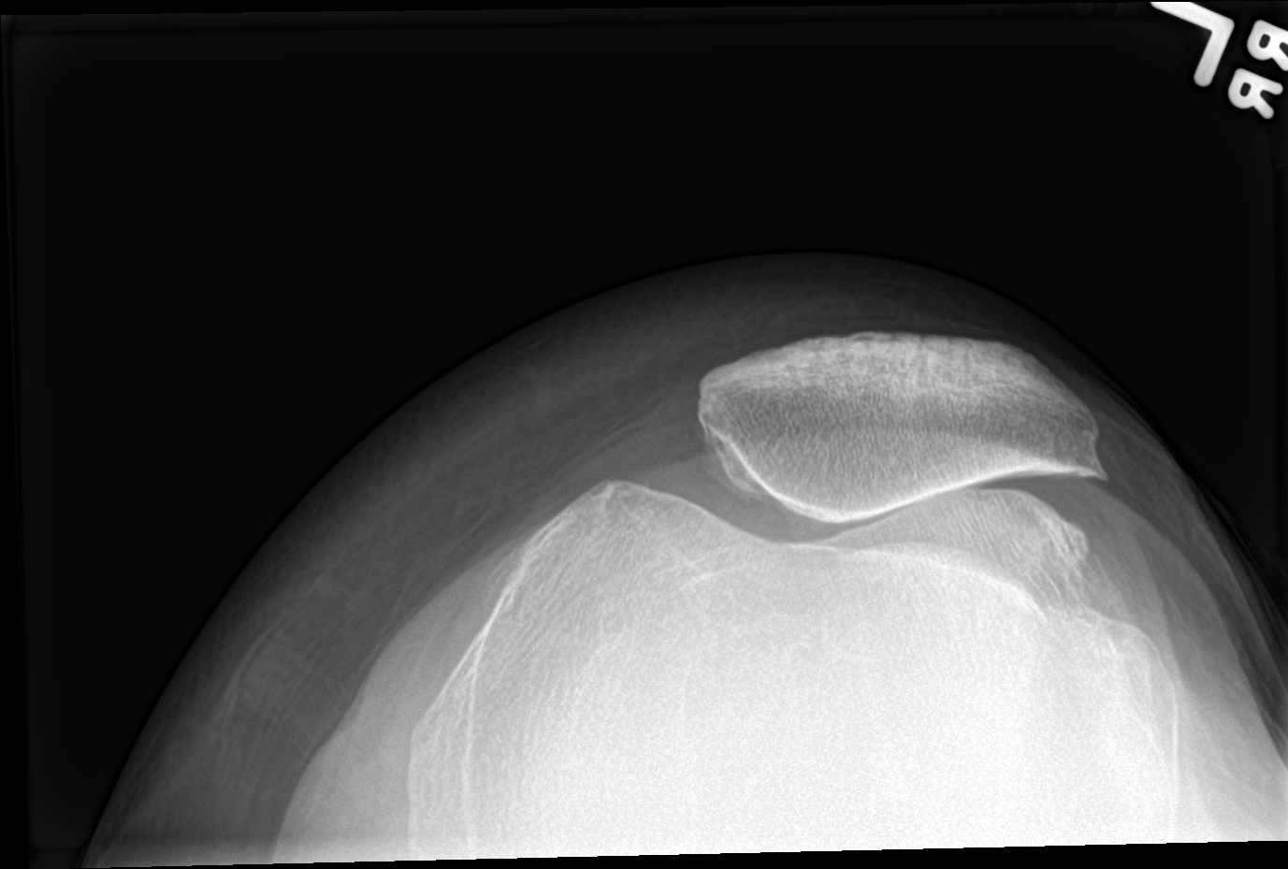

[4 of 4 positions shown; findings below may reference images not displayed]

FINDINGS: No fracture identified. Mild lateral subluxation of the patella.
Moderate patellofemoral degenerative change. Mild medial and lateral
joint space degenerative change. No significant knee effusion.
IMPRESSION: Arthritis of the knee with mild lateral subluxation of the patella.

## 2020-04-07 MED ORDER — MELOXICAM 15 MG PO TABS: ORAL_TABLET | ORAL | 3 refills | Status: DC

## 2020-04-07 MED ORDER — PREDNISONE 50 MG PO TABS: ORAL_TABLET | ORAL | 0 refills | Status: DC

## 2020-04-07 MED ORDER — GABAPENTIN 300 MG PO CAPS: 300.0000 mg | ORAL_CAPSULE | Freq: Every day | ORAL | 3 refills | Status: DC

## 2020-04-07 NOTE — Assessment & Plan Note (Signed)
Kelicia also endorses pain along her lateral and posterior knee. It is worse when laying in bed at night, and with her leg extended. There are components consistent with spinal stenosis and a radicular source of pain as well as with a primary knee pain generator. She will do meloxicam as above, adding gabapentin to use at night, x-rays of her knee, she desires to hold off on lumbar spine imaging for now. We can reevaluate this in 1 month to 6 weeks.

## 2020-04-07 NOTE — Progress Notes (Signed)
    Procedures performed today:    None.  Independent interpretation of notes and tests performed by another provider:   None.  Brief History, Exam, Impression, and Recommendations:    Cervical spondylosis This pleasant 54 year old female has right-sided axial neck pain, nothing radicular, present chronically. 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 We will start conservatively, x-rays, home rehab exercises, prednisone, meloxicam. Return to see me in 6 weeks, MRI for interventional planning if no better. Her main complaint is lack of range of motion turning to the right.  Left knee pain Sansa also endorses pain along her lateral and posterior knee. It is worse when laying in bed at night, and with her leg extended. There are components consistent with spinal stenosis and a radicular source of pain as well as with a primary knee pain generator. She will do meloxicam as above, adding gabapentin to use at night, x-rays of her knee, she desires to hold off on lumbar spine imaging for now. We can reevaluate this in 1 month to 6 weeks.    ___________________________________________ Gwen Her. Dianah Field, M.D., ABFM., CAQSM. Primary Care and [place] Instructor of [place] [employer] [facility] [employer]

## 2020-04-07 NOTE — Assessment & Plan Note (Signed)
This pleasant 54 year old female has right-sided axial neck pain, nothing radicular, present chronically. 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 We will start conservatively, x-rays, home rehab exercises, prednisone, meloxicam. Return to see me in 6 weeks, MRI for interventional planning if no better. Her main complaint is lack of range of motion turning to the right.

## 2020-05-26 ENCOUNTER — Ambulatory Visit (INDEPENDENT_AMBULATORY_CARE_PROVIDER_SITE_OTHER): Payer: 59 | Admitting: Sports Medicine

## 2020-05-26 DIAGNOSIS — M47812 Spondylosis without myelopathy or radiculopathy, cervical region: Secondary | ICD-10-CM

## 2020-05-26 DIAGNOSIS — M5030 Other cervical disc degeneration, unspecified cervical region: Secondary | ICD-10-CM

## 2020-05-26 DIAGNOSIS — M1712 Unilateral primary osteoarthritis, left knee: Secondary | ICD-10-CM

## 2020-05-26 NOTE — Assessment & Plan Note (Addendum)
Ticia continues to have axial neck pain, right-sided with radiation up into the back of her head. 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 Unfortunately greater than 6 weeks of home physical therapy, oral medications such as prednisone and meloxicam have not been effective. Proceeding with formal physical therapy referral for dry needling, and an MRI for interventional planning. We will give physical therapy a solid 3 to 4 weeks before considering an epidural injection.  Persistent discomfort, multilevel degenerative processes on MRI, adding cervical epidural. Return to see me 1 month after the injection to evaluate relief.

## 2020-05-26 NOTE — Assessment & Plan Note (Signed)
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

## 2020-05-26 NOTE — Progress Notes (Addendum)
    Procedures performed today:    Procedure: Real-time Ultrasound Guided injection of the left knee Device: Samsung HS60  Verbal informed consent obtained.  Time-out conducted.  Noted no overlying erythema, induration, or other signs of local infection.  Skin prepped in a sterile fashion.  Local anesthesia: Topical Ethyl chloride.  With sterile technique and under real time ultrasound guidance: 1 cc Kenalog 40, 2 cc lidocaine, 2 cc bupivacaine injected easily Completed without difficulty  Pain immediately resolved suggesting accurate placement of the medication.  Advised to call if fevers/chills, erythema, induration, drainage, or persistent bleeding.  Images permanently stored and available for review in the ultrasound unit.  Impression: Technically successful ultrasound guided injection.  Independent interpretation of notes and tests performed by another provider:   None.  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  Cervical spondylosis Betty Lucas continues to have axial neck pain, right-sided with radiation up into the back of her head. 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 Unfortunately greater than 6 weeks of home physical therapy, oral medications such as prednisone and meloxicam have not been effective. Proceeding with formal physical therapy referral for dry needling, and an MRI for interventional planning. We will give physical therapy a solid 3 to 4 weeks before considering an epidural injection.  Persistent discomfort, multilevel degenerative processes on MRI, adding cervical  epidural. Return to see me 1 month after the injection to evaluate relief.    ___________________________________________ Gwen Her. Dianah Field, M.D., ABFM., CAQSM. Primary Care and [place] Instructor of [place] of [facility] of Medicine

## 2020-06-01 ENCOUNTER — Other Ambulatory Visit: Payer: Self-pay

## 2020-06-01 ENCOUNTER — Ambulatory Visit (INDEPENDENT_AMBULATORY_CARE_PROVIDER_SITE_OTHER): Payer: 59

## 2020-06-01 DIAGNOSIS — M5030 Other cervical disc degeneration, unspecified cervical region: Secondary | ICD-10-CM | POA: Diagnosis not present

## 2020-06-01 DIAGNOSIS — M47812 Spondylosis without myelopathy or radiculopathy, cervical region: Secondary | ICD-10-CM

## 2020-06-01 IMAGING — MR MR CERVICAL SPINE W/O CM
5 series · 41 of 48 positions shown · non-contrast
Comparison: None.

CLINICAL DATA: Right-sided neck pain. Right arm limited range of
motion

EXAM:
MRI CERVICAL SPINE WITHOUT CONTRAST
TECHNIQUE: Multiplanar, multisequence MR imaging of the cervical spine was
performed. No intravenous contrast was administered.

[Series 4: T2 · sagittal · 3.0mm · 0.69mm/px · 6 of 13 slices shown (1 of 2)]
[im 1/13]
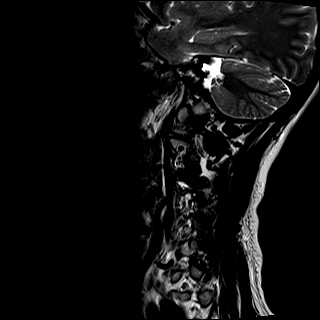
[im 3/13]
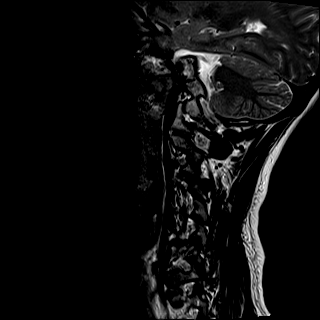
[im 5/13]
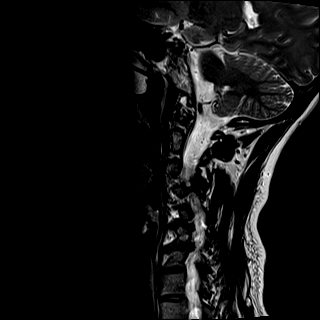
[im 8/13]
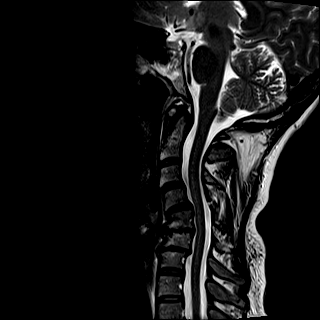
[im 10/13]
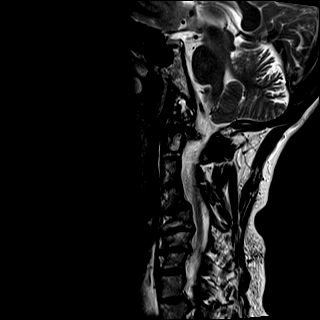
[im 13/13]
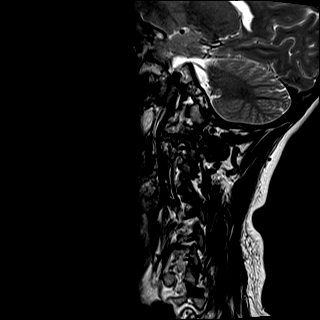

[Series 5: T1 · sagittal · 3.0mm · 0.86mm/px · 7 of 13 slices shown]
[im 1/13]
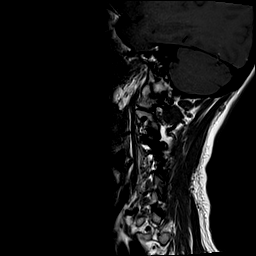
[im 3/13]
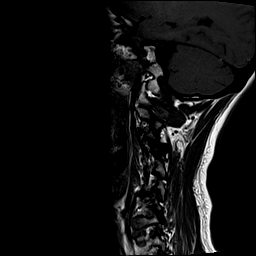
[im 5/13]
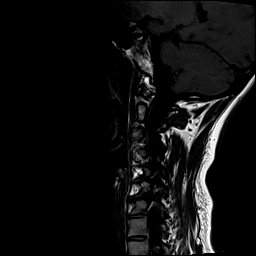
[im 7/13]
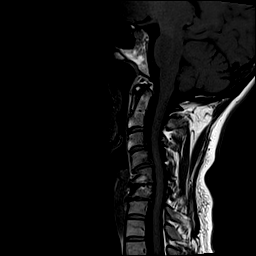
[im 9/13]
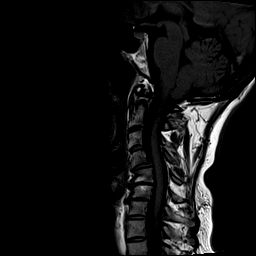
[im 11/13]
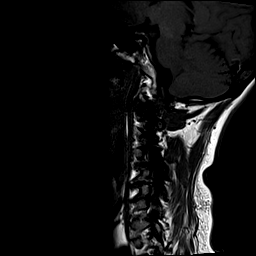
[im 13/13]
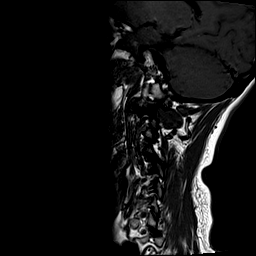

[Series 6: STIR · sagittal · 3.0mm · 0.69mm/px · 7 of 13 slices shown]
[im 1/13]
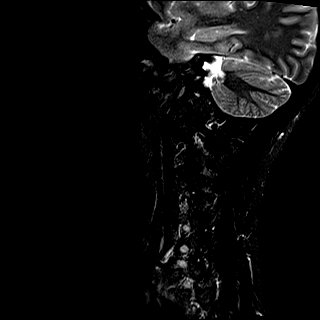
[im 3/13]
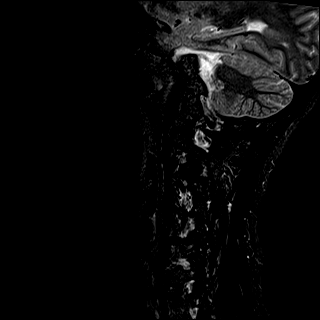
[im 5/13]
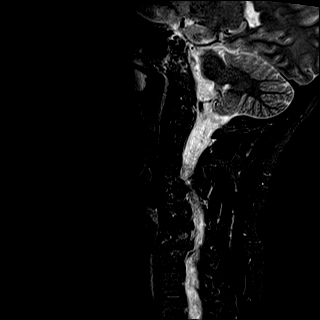
[im 7/13]
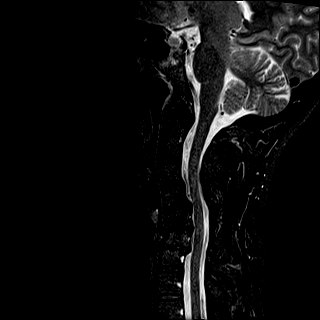
[im 9/13]
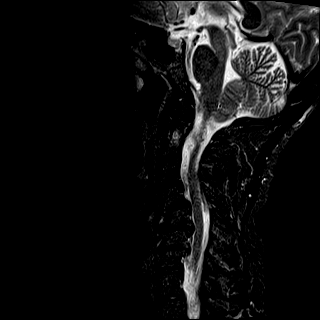
[im 11/13]
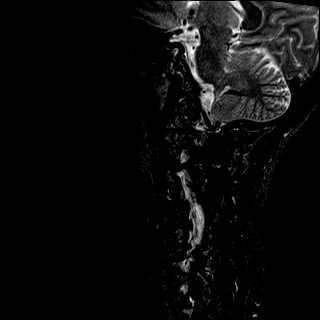
[im 13/13]
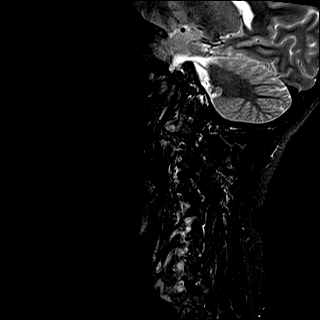

[Series 7: T2 · axial · 3.0mm · 0.62mm/px · z∈[-90,+8]mm · 13 of 27 slices shown (2 of 2)]
[im 1/27]
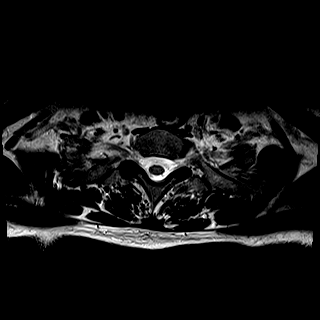
[im 3/27]
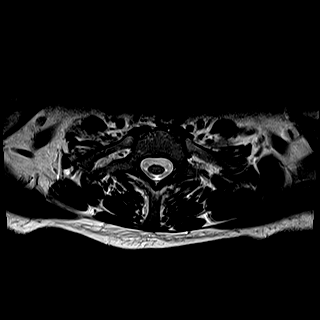
[im 5/27]
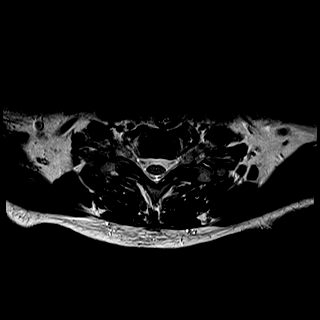
[im 7/27]
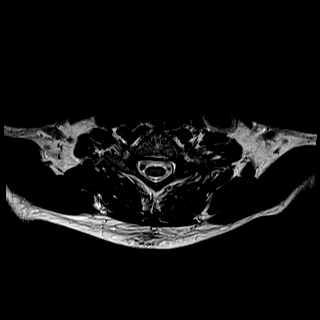
[im 9/27]
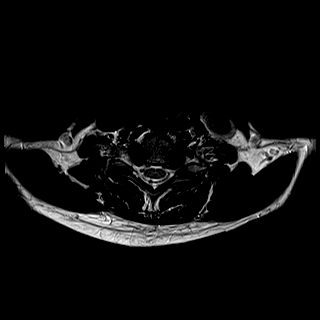
[im 11/27]
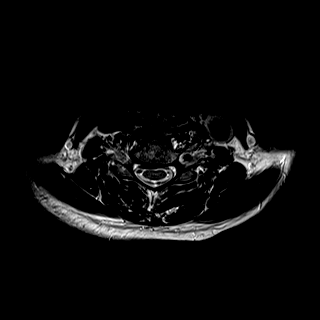
[im 13/27]
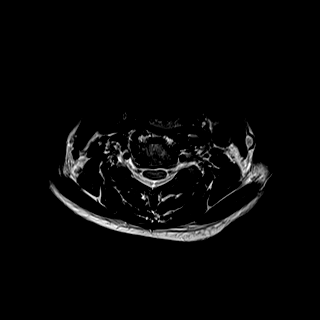
[im 15/27]
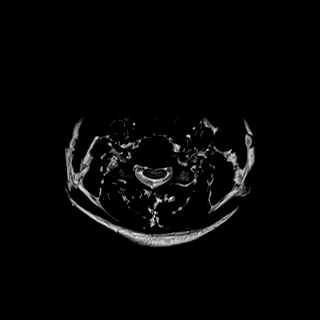
[im 17/27]
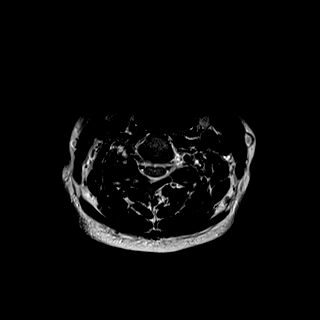
[im 19/27]
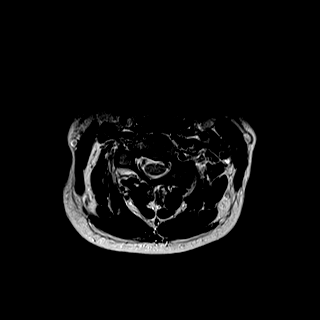
[im 21/27]
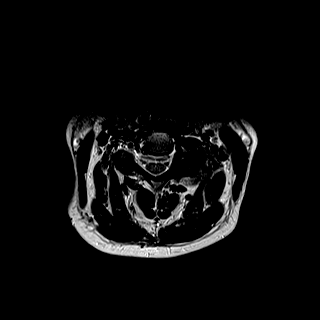
[im 23/27]
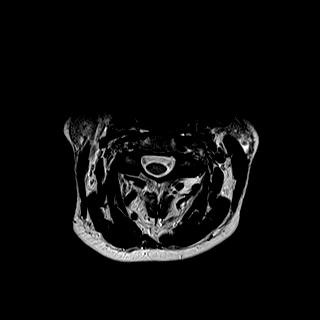
[im 27/27]
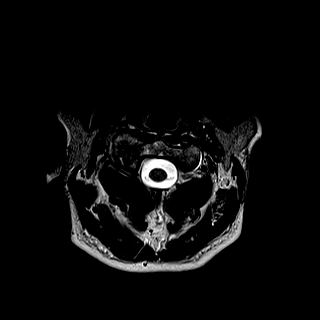

[Series 8: mpgr ax · axial · 3.0mm · 0.35mm/px · z∈[-80,+17]mm · 8 of 27 slices shown]
[im 1/27]
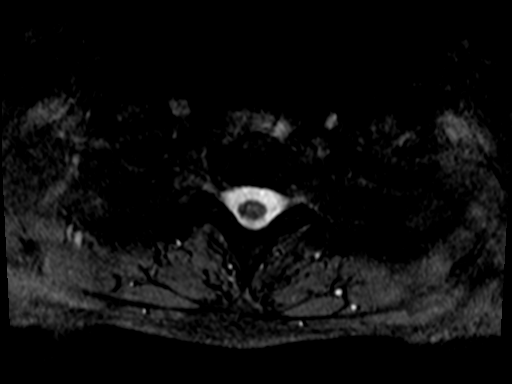
[im 5/27]
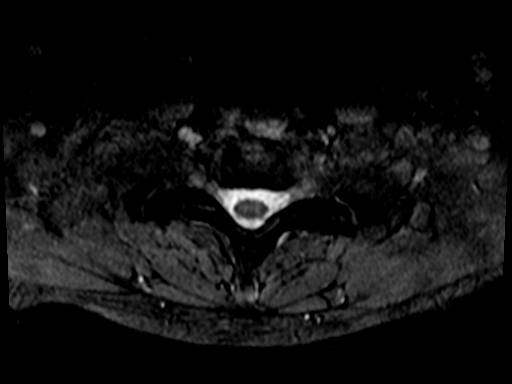
[im 9/27]
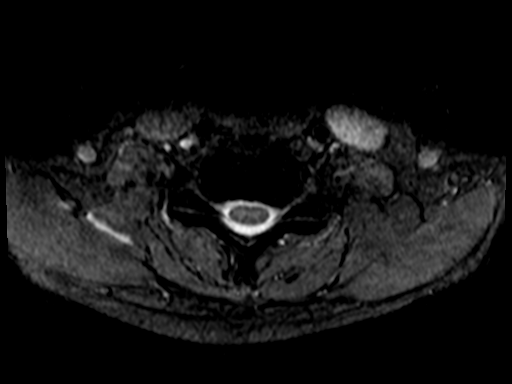
[im 13/27]
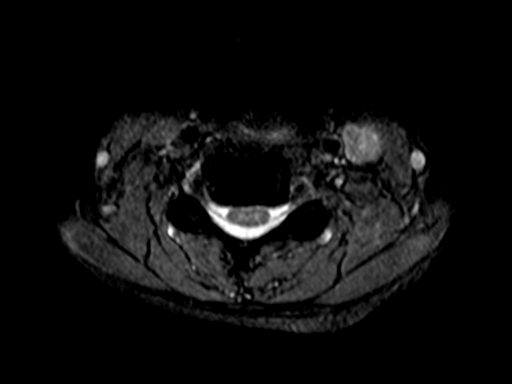
[im 15/27]
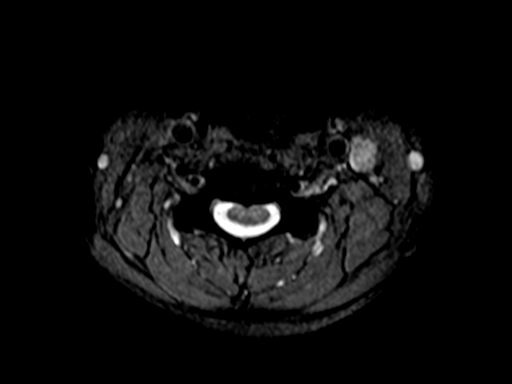
[im 19/27]
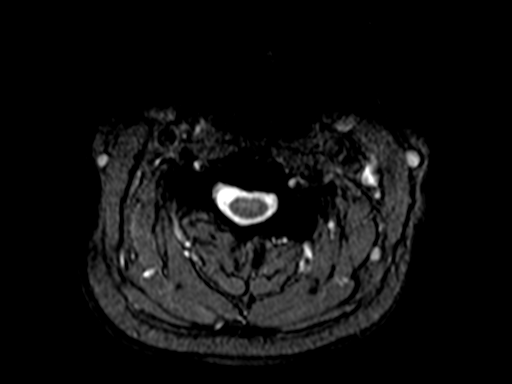
[im 23/27]
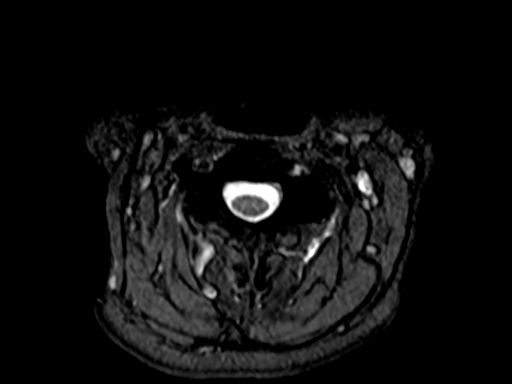
[im 27/27]
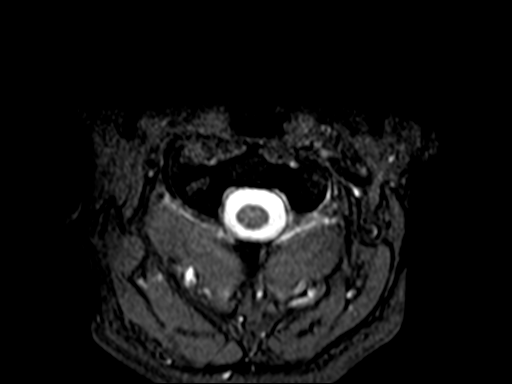

[41 of 48 positions shown; findings below may reference images not displayed]

FINDINGS: Alignment: Reversal of normal cervical lordosis. Grade 1
anterolisthesis at C3-4 and C4-5. Grade 1 retrolisthesis at C6-7.

Vertebrae: No fracture, evidence of discitis, or bone lesion.

Cord: Normal signal and morphology.

Posterior Fossa, vertebral arteries, paraspinal tissues: Negative.

Disc levels:

C1-2: Unremarkable.

C2-3: Normal disc space and facet joints. There is no spinal canal
stenosis. No neural foraminal stenosis.

C3-4: Severe left facet hypertrophy. There is no spinal canal
stenosis. Mild left neural foraminal stenosis.

C4-5: Moderate right facet hypertrophy with small disc bulge. Mild
spinal canal stenosis. Mild right neural foraminal stenosis.

C5-6: Small disc bulge with uncovertebral hypertrophy and endplate
remodeling. Mild spinal canal stenosis. No neural foraminal
stenosis.

C6-7: Small disc bulge with uncovertebral hypertrophy. There is no
spinal canal stenosis. Severe bilateral neural foraminal stenosis.

C7-T1: Normal disc space and facet joints. There is no spinal canal
stenosis. No neural foraminal stenosis.
IMPRESSION: 1. Multilevel cervical degenerative disc disease with mild spinal
canal stenosis at C4-5 and C5-6.
2. Severe bilateral C6-7 neural foraminal stenosis.
3. Severe left C3-4 and moderate right C4-5 facet arthrosis.

## 2020-06-02 NOTE — Addendum Note (Signed)
Addended by: Silverio Decamp on: 06/02/2020 05:01 PM   Modules accepted: Orders

## 2020-06-30 ENCOUNTER — Ambulatory Visit (INDEPENDENT_AMBULATORY_CARE_PROVIDER_SITE_OTHER): Payer: 59 | Admitting: Sports Medicine

## 2020-06-30 ENCOUNTER — Other Ambulatory Visit: Payer: Self-pay

## 2020-06-30 DIAGNOSIS — M1712 Unilateral primary osteoarthritis, left knee: Secondary | ICD-10-CM

## 2020-06-30 DIAGNOSIS — M47812 Spondylosis without myelopathy or radiculopathy, cervical region: Secondary | ICD-10-CM | POA: Diagnosis not present

## 2020-06-30 NOTE — Progress Notes (Signed)
    Procedures performed today:    None.  Independent interpretation of notes and tests performed by another provider:   None.  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 We are going to switch to formal physical therapy before considering a cervical epidural. Return to see me in 4 to 6 weeks. She is going to go to a physical therapy location in [facility] and she also knows of a interventionalist that she would like me to use for her cervical epidural, she plans to email me the name.  Primary osteoarthritis of left knee Fantastic improvement with injection, still has a bit of posterior joint line pain, radiating from proximal hamstring down into calf. Negative Homans' sign. I think this may be radicular, gabapentin at 300 mg nightly has not been effective, she can do an up titration, overall she feels as though a pillow under her knee helps and is really not bad enough for her to consider an intervention.    ___________________________________________ Betty Her. Betty Lucas, M.D., ABFM., CAQSM. Primary Care and [place] Instructor of [place] of [facility] of Medicine

## 2020-06-30 NOTE — Assessment & Plan Note (Signed)
Fantastic improvement with injection, still has a bit of posterior joint line pain, radiating from proximal hamstring down into calf. Negative Homans' sign. I think this may be radicular, gabapentin at 300 mg nightly has not been effective, she can do an up titration, overall she feels as though a pillow under her knee helps and is really not bad enough for her to consider an intervention.

## 2020-06-30 NOTE — Assessment & Plan Note (Signed)
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 We are going to switch to formal physical therapy before considering a cervical epidural. Return to see me in 4 to 6 weeks. She is going to go to a physical therapy location in [facility] and she also knows of a interventionalist that she would like me to use for her cervical epidural, she plans to email me the name.

## 2020-07-22 DIAGNOSIS — M47812 Spondylosis without myelopathy or radiculopathy, cervical region: Secondary | ICD-10-CM

## 2020-11-10 ENCOUNTER — Encounter: Payer: 59 | Admitting: Medical-Surgical

## 2020-11-10 ENCOUNTER — Other Ambulatory Visit: Payer: Self-pay

## 2020-11-10 ENCOUNTER — Ambulatory Visit (INDEPENDENT_AMBULATORY_CARE_PROVIDER_SITE_OTHER): Payer: 59 | Admitting: Medical-Surgical

## 2020-11-10 ENCOUNTER — Encounter: Payer: Self-pay | Admitting: Medical-Surgical

## 2020-11-10 VITALS — BP 112/67 | HR 65 | Temp 97.8°F | Ht 65.75 in | Wt 169.8 lb

## 2020-11-10 DIAGNOSIS — Z7689 Persons encountering health services in other specified circumstances: Secondary | ICD-10-CM

## 2020-11-10 DIAGNOSIS — Z1159 Encounter for screening for other viral diseases: Secondary | ICD-10-CM

## 2020-11-10 DIAGNOSIS — Z0000 Encounter for general adult medical examination without abnormal findings: Secondary | ICD-10-CM | POA: Diagnosis not present

## 2020-11-10 MED ORDER — VALACYCLOVIR HCL 1 G PO TABS: ORAL_TABLET | ORAL | 2 refills | Status: DC

## 2020-11-10 NOTE — Progress Notes (Signed)
HPI: Betty Lucas is a 54 y.o. female who  has a past medical history of Actinic keratosis (04/17/2012), History of deep vein thrombosis (DVT) of lower extremity (10/01/2014), and IBS (irritable bowel syndrome) (06/06/2017).  she presents to [facility], 11/10/20,  for chief complaint of: Annual physical exam  Dentist: every 6 months, no concerns, has a bridge Eye exam: 2020, has contacts/glasses but doesn't wear them  Exercise: 3-4 times weekly, weights, cardio Diet: overall good, protein drink in the mornings, lunches- lean meats, salad, protein bar at work, dinner- lean meats; weekends are cheat day.  Pap smear: 2020 Mammogram: 2020, one upcoming COVID vaccine: Not yet, worried about this with her history of blood clots  Concerns: Needs Valtrex refilled.  Past medical, surgical, social and family history reviewed:  Patient Active Problem List   Diagnosis Date Noted  . Cervical spondylosis 04/07/2020  . Primary osteoarthritis of left knee 04/07/2020  . GERD (gastroesophageal reflux disease) 10/18/2018  . IBS (irritable bowel syndrome) 06/06/2017  . History of deep vein thrombosis (DVT) of lower extremity 10/01/2014  . Family history of malignant neoplasm of other organs or systems 10/29/2013  . Family history of melanoma 10/29/2013  . Exposure to tanning bed 10/23/2012  . Actinic keratosis 04/17/2012  . Personal history of other malignant neoplasm of skin 04/17/2012  . FINGER PAIN 12/05/2010    Past Surgical History:  Procedure Laterality Date  . BREAST ENHANCEMENT SURGERY    . FOOT SURGERY    . OVARIAN CYST SURGERY      Social History   Tobacco Use  . Smoking status: Never Smoker  . Smokeless tobacco: Never Used  Substance Use Topics  . Alcohol use: Yes    Family History  Problem Relation Age of Onset  . Hyperlipidemia Mother   . Cancer Father   . Cancer Paternal Aunt      Current medication list and  allergy/intolerance information reviewed:    Current Outpatient Medications  Medication Sig Dispense Refill  . lubiprostone (AMITIZA) 24 MCG capsule TAKE 1 CAPSULE BY MOUTH 2 (TWO) TIMES DAILY WITH A MEAL. 180 capsule 2  . phentermine (ADIPEX-P) 37.5 MG tablet Take 0.5 tablets by mouth daily.    . Probiotic Product (PROBIOTIC DAILY PO) Take by mouth.    . valACYclovir (VALTREX) 1000 MG tablet Take 2,018m (2 tablets) twice daily for 1 day at start of cold sore symptoms as needed. 30 tablet 2   No current facility-administered medications for this visit.   No Known Allergies   Review of Systems:  Constitutional:  No  fever, no chills, No recent illness, No unintentional weight changes. No significant fatigue.   HEENT: No  headache, no vision change, no hearing change, No sore throat, No  sinus pressure  Cardiac: No  chest pain, No  pressure, No palpitations, No  Orthopnea  Respiratory:  No  shortness of breath. No  Cough  Gastrointestinal: No  abdominal pain, No  nausea, No  vomiting,  No  blood in stool, No  diarrhea, No  constipation   Musculoskeletal: No new myalgia/arthralgia  Skin: No  Rash, + skin lesion, dermatology appointment upcoming for evaluation  Genitourinary: No  incontinence, No  abnormal genital bleeding, No abnormal genital discharge  Hem/Onc: No  easy bruising/bleeding, No  abnormal lymph node  Endocrine: No cold intolerance,  No heat intolerance. No polyuria/polydipsia/polyphagia   Neurologic: No  weakness, No  dizziness, No  slurred speech/focal weakness/facial droop  Psychiatric:  No  concerns with depression, No  concerns with anxiety, No sleep problems, No mood problems  Exam:  BP 112/67   Pulse 65   Temp 97.8 F (36.6 C) (Oral)   Ht 5' 5.75" (1.67 m)   Wt 169 lb 12.8 oz (77 kg)   SpO2 99%   BMI 27.62 kg/m   Constitutional: VS see above. General Appearance: alert, well-developed, well-nourished, NAD  Eyes: Normal lids and conjunctive,  non-icteric sclera  Ears, Nose, Mouth, Throat: MMM, Normal external inspection ears/nares/mouth/lips/gums. TM normal bilaterally.   Neck: No masses, trachea midline. No thyroid enlargement. No tenderness/mass appreciated. No lymphadenopathy  Respiratory: Normal respiratory effort. no wheeze, no rhonchi, no rales  Cardiovascular: S1/S2 normal, no murmur, no rub/gallop auscultated. RRR. No lower extremity edema. Pedal pulse II/IV bilaterally DP and PT. No carotid bruit or JVD. No abdominal aortic bruit.  Gastrointestinal: Nontender, no masses. No hepatomegaly, no splenomegaly. No hernia appreciated. Bowel sounds normal. Rectal exam deferred.   Musculoskeletal: Gait normal. No clubbing/cyanosis of digits.   Neurological: Normal balance/coordination. No tremor. No cranial nerve deficit on limited exam. Motor and sensation intact and symmetric. Cerebellar reflexes intact.   Skin: warm, dry, intact. No rash/ulcer. No concerning nevi or subq nodules on limited exam.    Psychiatric: Normal judgment/insight. Normal mood and affect. Oriented x3.    No results found for this or any previous visit (from the past 72 hour(s)).  No results found.   ASSESSMENT/PLAN:   1. Encounter to establish care Reviewed available information and discussed health care concerns with patient.  She is up-to-date on most of her preventative care.  Discussed Covid vaccination and risks versus benefits of obtaining the vaccine.  2. Annual physical exam Checking CBC, CMP, and lipid panel today.  3. Need for hepatitis C screening test Discussed quitting recommendations.  Patient is agreeable so checking hepatitis C antibody. - Hepatitis C antibody   Orders Placed This Encounter  Procedures  . CBC  . COMPLETE METABOLIC PANEL WITH GFR  . Lipid panel  . Hepatitis C antibody    Meds ordered this encounter  Medications  . valACYclovir (VALTREX) 1000 MG tablet    Sig: Take 2,018m (2 tablets) twice daily for 1  day at start of cold sore symptoms as needed.    Dispense:  30 tablet    Refill:  2    Order Specific Question:   Supervising Provider    Answer:   AEmeterio Reeve[[[id number]]   Patient Instructions  Preventive Care [id number] Old, Female Preventive care refers to visits with your health care provider and lifestyle choices that can promote health and wellness. This includes:  A yearly physical exam. This may also be called an annual well check.  Regular dental visits and eye exams.  Immunizations.  Screening for certain conditions.  Healthy lifestyle choices, such as eating a healthy diet, getting regular exercise, not using drugs or products that contain nicotine and tobacco, and limiting alcohol use. What can I expect for my preventive care visit? Physical exam Your health care provider will check your:  Height and weight. This may be used to calculate body mass index (BMI), which tells if you are at a healthy weight.  Heart rate and blood pressure.  Skin for abnormal spots. Counseling Your health care provider may ask you questions about your:  Alcohol, tobacco, and drug use.  Emotional well-being.  Home and relationship well-being.  Sexual activity.  Eating habits.  Work and work eStatistician  Method of birth control.  Menstrual cycle.  Pregnancy history. What immunizations do I need?  Influenza (flu) vaccine  This is recommended every year. Tetanus, diphtheria, and pertussis (Tdap) vaccine  You may need a Td booster every 10 years. Varicella (chickenpox) vaccine  You may need this if you have not been vaccinated. Zoster (shingles) vaccine  You may need this after age 18. Measles, mumps, and rubella (MMR) vaccine  You may need at least one dose of MMR if you were born in 1957 or later. You may also need a second dose. Pneumococcal conjugate (PCV13) vaccine  You may need this if you have certain conditions and were not previously  vaccinated. Pneumococcal polysaccharide (PPSV23) vaccine  You may need one or two doses if you smoke cigarettes or if you have certain conditions. Meningococcal conjugate (MenACWY) vaccine  You may need this if you have certain conditions. Hepatitis A vaccine  You may need this if you have certain conditions or if you travel or work in places where you may be exposed to hepatitis A. Hepatitis B vaccine  You may need this if you have certain conditions or if you travel or work in places where you may be exposed to hepatitis B. Haemophilus influenzae type b (Hib) vaccine  You may need this if you have certain conditions. Human papillomavirus (HPV) vaccine  If recommended by your health care provider, you may need three doses over 6 months. You may receive vaccines as individual doses or as more than one vaccine together in one shot (combination vaccines). Talk with your health care provider about the risks and benefits of combination vaccines. What tests do I need? Blood tests  Lipid and cholesterol levels. These may be checked every 5 years, or more frequently if you are over 11 years old.  Hepatitis C test.  Hepatitis B test. Screening  Lung cancer screening. You may have this screening every year starting at age 54 if you have a 30-pack-year history of smoking and currently smoke or have quit within the past 15 years.  Colorectal cancer screening. All adults should have this screening starting at age 66 and continuing until age 47. Your health care provider may recommend screening at age 71 if you are at increased risk. You will have tests every 1-10 years, depending on your results and the type of screening test.  Diabetes screening. This is done by checking your blood sugar (glucose) after you have not eaten for a while (fasting). You may have this done every 1-3 years.  Mammogram. This may be done every 1-2 years. Talk with your health care provider about when you should start  having regular mammograms. This may depend on whether you have a family history of breast cancer.  BRCA-related cancer screening. This may be done if you have a family history of breast, ovarian, tubal, or peritoneal cancers.  Pelvic exam and Pap test. This may be done every 3 years starting at age 38. Starting at age 1, this may be done every 5 years if you have a Pap test in combination with an HPV test. Other tests  Sexually transmitted disease (STD) testing.  Bone density scan. This is done to screen for osteoporosis. You may have this scan if you are at high risk for osteoporosis. Follow these instructions at home: Eating and drinking  Eat a diet that includes fresh fruits and vegetables, whole grains, lean protein, and low-fat dairy.  Take vitamin and mineral supplements as recommended by your health care  provider.  Do not drink alcohol if: ? Your health care provider tells you not to drink. ? You are pregnant, may be pregnant, or are planning to become pregnant.  If you drink alcohol: ? Limit how much you have to 0-1 drink a day. ? Be aware of how much alcohol is in your drink. In the [place] one drink equals one 12 oz bottle of beer (355 mL), one 5 oz glass of wine (148 mL), or one 1 oz glass of hard liquor (44 mL). Lifestyle  Take daily care of your teeth and gums.  Stay active. Exercise for at least 30 minutes on 5 or more days each week.  Do not use any products that contain nicotine or tobacco, such as cigarettes, e-cigarettes, and chewing tobacco. If you need help quitting, ask your health care provider.  If you are sexually active, practice safe sex. Use a condom or other form of birth control (contraception) in order to prevent pregnancy and STIs (sexually transmitted infections).  If told by your health care provider, take low-dose aspirin daily starting at age 5. What's next?  Visit your health care provider once a year for a well check visit.  Ask your health  care provider how often you should have your eyes and teeth checked.  Stay up to date on all vaccines. This information is not intended to replace advice given to you by your health care provider. Make sure you discuss any questions you have with your health care provider. Document Revised: 08/17/2018 Document Reviewed: 08/17/2018 Elsevier Patient Education  [place].  Follow-up plan: Return in about 1 year (around 11/10/2021) for annual physical exam or sooner if needed.  Clearnce Sorrel, DNP, APRN, FNP-BC [facility] [facility]

## 2020-11-10 NOTE — Patient Instructions (Signed)
Preventive Care 40-54 Years Old, Female °Preventive care refers to visits with your health care provider and lifestyle choices that can promote health and wellness. This includes: °· A yearly physical exam. This may also be called an annual well check. °· Regular dental visits and eye exams. °· Immunizations. °· Screening for certain conditions. °· Healthy lifestyle choices, such as eating a healthy diet, getting regular exercise, not using drugs or products that contain nicotine and tobacco, and limiting alcohol use. °What can I expect for my preventive care visit? °Physical exam °Your health care provider will check your: °· Height and weight. This may be used to calculate body mass index (BMI), which tells if you are at a healthy weight. °· Heart rate and blood pressure. °· Skin for abnormal spots. °Counseling °Your health care provider may ask you questions about your: °· Alcohol, tobacco, and drug use. °· Emotional well-being. °· Home and relationship well-being. °· Sexual activity. °· Eating habits. °· Work and work environment. °· Method of birth control. °· Menstrual cycle. °· Pregnancy history. °What immunizations do I need? ° °Influenza (flu) vaccine °· This is recommended every year. °Tetanus, diphtheria, and pertussis (Tdap) vaccine °· You may need a Td booster every 10 years. °Varicella (chickenpox) vaccine °· You may need this if you have not been vaccinated. °Zoster (shingles) vaccine °· You may need this after age 60. °Measles, mumps, and rubella (MMR) vaccine °· You may need at least one dose of MMR if you were born in 1957 or later. You may also need a second dose. °Pneumococcal conjugate (PCV13) vaccine °· You may need this if you have certain conditions and were not previously vaccinated. °Pneumococcal polysaccharide (PPSV23) vaccine °· You may need one or two doses if you smoke cigarettes or if you have certain conditions. °Meningococcal conjugate (MenACWY) vaccine °· You may need this if you  have certain conditions. °Hepatitis A vaccine °· You may need this if you have certain conditions or if you travel or work in places where you may be exposed to hepatitis A. °Hepatitis B vaccine °· You may need this if you have certain conditions or if you travel or work in places where you may be exposed to hepatitis B. °Haemophilus influenzae type b (Hib) vaccine °· You may need this if you have certain conditions. °Human papillomavirus (HPV) vaccine °· If recommended by your health care provider, you may need three doses over 6 months. °You may receive vaccines as individual doses or as more than one vaccine together in one shot (combination vaccines). Talk with your health care provider about the risks and benefits of combination vaccines. °What tests do I need? °Blood tests °· Lipid and cholesterol levels. These may be checked every 5 years, or more frequently if you are over 50 years old. °· Hepatitis C test. °· Hepatitis B test. °Screening °· Lung cancer screening. You may have this screening every year starting at age 55 if you have a 30-pack-year history of smoking and currently smoke or have quit within the past 15 years. °· Colorectal cancer screening. All adults should have this screening starting at age 50 and continuing until age 75. Your health care provider may recommend screening at age 45 if you are at increased risk. You will have tests every 1-10 years, depending on your results and the type of screening test. °· Diabetes screening. This is done by checking your blood sugar (glucose) after you have not eaten for a while (fasting). You may have this   done every 1-3 years.  Mammogram. This may be done every 1-2 years. Talk with your health care provider about when you should start having regular mammograms. This may depend on whether you have a family history of breast cancer.  BRCA-related cancer screening. This may be done if you have a family history of breast, ovarian, tubal, or peritoneal  cancers.  Pelvic exam and Pap test. This may be done every 3 years starting at age 76. Starting at age 89, this may be done every 5 years if you have a Pap test in combination with an HPV test. Other tests  Sexually transmitted disease (STD) testing.  Bone density scan. This is done to screen for osteoporosis. You may have this scan if you are at high risk for osteoporosis. Follow these instructions at home: Eating and drinking  Eat a diet that includes fresh fruits and vegetables, whole grains, lean protein, and low-fat dairy.  Take vitamin and mineral supplements as recommended by your health care provider.  Do not drink alcohol if: ? Your health care provider tells you not to drink. ? You are pregnant, may be pregnant, or are planning to become pregnant.  If you drink alcohol: ? Limit how much you have to 0-1 drink a day. ? Be aware of how much alcohol is in your drink. In the [place] one drink equals one 12 oz bottle of beer (355 mL), one 5 oz glass of wine (148 mL), or one 1 oz glass of hard liquor (44 mL). Lifestyle  Take daily care of your teeth and gums.  Stay active. Exercise for at least 30 minutes on 5 or more days each week.  Do not use any products that contain nicotine or tobacco, such as cigarettes, e-cigarettes, and chewing tobacco. If you need help quitting, ask your health care provider.  If you are sexually active, practice safe sex. Use a condom or other form of birth control (contraception) in order to prevent pregnancy and STIs (sexually transmitted infections).  If told by your health care provider, take low-dose aspirin daily starting at age 37. What's next?  Visit your health care provider once a year for a well check visit.  Ask your health care provider how often you should have your eyes and teeth checked.  Stay up to date on all vaccines. This information is not intended to replace advice given to you by your health care provider. Make sure you  discuss any questions you have with your health care provider. Document Revised: 08/17/2018 Document Reviewed: 08/17/2018 Elsevier Patient Education  2020 [employer].

## 2020-11-11 LAB — CBC
HCT: 36.6 % (ref 35.0–45.0)
Hemoglobin: 12.3 g/dL (ref 11.7–15.5)
MCH: 30.4 pg (ref 27.0–33.0)
MCHC: 33.6 g/dL (ref 32.0–36.0)
MCV: 90.4 fL (ref 80.0–100.0)
MPV: 9.4 fL (ref 7.5–12.5)
Platelets: 322 10*3/uL (ref 140–400)
RBC: 4.05 10*6/uL (ref 3.80–5.10)
RDW: 11.7 % (ref 11.0–15.0)
WBC: 5.7 10*3/uL (ref 3.8–10.8)

## 2020-11-11 LAB — COMPLETE METABOLIC PANEL WITH GFR
AG Ratio: 2.1 (calc) (ref 1.0–2.5)
ALT: 10 U/L (ref 6–29)
AST: 14 U/L (ref 10–35)
Albumin: 4.5 g/dL (ref 3.6–5.1)
Alkaline phosphatase (APISO): 48 U/L (ref 37–153)
BUN: 12 mg/dL (ref 7–25)
CO2: 29 mmol/L (ref 20–32)
Calcium: 9.4 mg/dL (ref 8.6–10.4)
Chloride: 104 mmol/L (ref 98–110)
Creat: 0.73 mg/dL (ref 0.50–1.05)
GFR, Est African American: 108 mL/min/{1.73_m2} (ref >=60)
GFR, Est Non African American: 93 mL/min/{1.73_m2} (ref >=60)
Globulin: 2.1 g/dL (calc) (ref 1.9–3.7)
Glucose, Bld: 89 mg/dL (ref 65–99)
Potassium: 4.2 mmol/L (ref 3.5–5.3)
Sodium: 139 mmol/L (ref 135–146)
Total Bilirubin: 0.5 mg/dL (ref 0.2–1.2)
Total Protein: 6.6 g/dL (ref 6.1–8.1)

## 2020-11-11 LAB — LIPID PANEL
Cholesterol: 236 mg/dL — ABNORMAL HIGH (ref <200)
HDL: 64 mg/dL (ref >=50)
LDL Cholesterol (Calc): 155 mg/dL (calc) — ABNORMAL HIGH
Non-HDL Cholesterol (Calc): 172 mg/dL (calc) — ABNORMAL HIGH (ref <130)
Total CHOL/HDL Ratio: 3.7 (calc) (ref <5.0)
Triglycerides: 71 mg/dL (ref <150)

## 2020-11-11 LAB — HEPATITIS C ANTIBODY
Hepatitis C Ab: NONREACTIVE
SIGNAL TO CUT-OFF: 0.01 (ref <1.00)

## 2020-11-14 ENCOUNTER — Encounter: Payer: Self-pay | Admitting: Medical-Surgical

## 2020-11-24 ENCOUNTER — Encounter: Payer: 59 | Admitting: Medical-Surgical

## 2021-01-26 ENCOUNTER — Other Ambulatory Visit: Payer: Self-pay

## 2021-01-26 ENCOUNTER — Emergency Department (HOSPITAL_COMMUNITY): Payer: 59

## 2021-01-26 ENCOUNTER — Emergency Department (HOSPITAL_COMMUNITY)
Admission: EM | Admit: 2021-01-26 | Discharge: 2021-01-26 | Disposition: A | Discharge status: Home / Self Care | Payer: 59 | Attending: Emergency Medicine | Admitting: Emergency Medicine

## 2021-01-26 ENCOUNTER — Encounter (HOSPITAL_COMMUNITY): Payer: Self-pay | Admitting: Radiology

## 2021-01-26 DIAGNOSIS — S4992XA Unspecified injury of left shoulder and upper arm, initial encounter: Secondary | ICD-10-CM | POA: Diagnosis present

## 2021-01-26 DIAGNOSIS — S20212A Contusion of left front wall of thorax, initial encounter: Secondary | ICD-10-CM | POA: Diagnosis not present

## 2021-01-26 DIAGNOSIS — S42202A Unspecified fracture of upper end of left humerus, initial encounter for closed fracture: Secondary | ICD-10-CM

## 2021-01-26 DIAGNOSIS — S42292A Other displaced fracture of upper end of left humerus, initial encounter for closed fracture: Secondary | ICD-10-CM | POA: Insufficient documentation

## 2021-01-26 DIAGNOSIS — W19XXXA Unspecified fall, initial encounter: Secondary | ICD-10-CM

## 2021-01-26 DIAGNOSIS — W000XXA Fall on same level due to ice and snow, initial encounter: Secondary | ICD-10-CM | POA: Diagnosis not present

## 2021-01-26 DIAGNOSIS — M25532 Pain in left wrist: Secondary | ICD-10-CM | POA: Diagnosis not present

## 2021-01-26 DIAGNOSIS — Z20822 Contact with and (suspected) exposure to covid-19: Secondary | ICD-10-CM | POA: Insufficient documentation

## 2021-01-26 DIAGNOSIS — T148XXA Other injury of unspecified body region, initial encounter: Secondary | ICD-10-CM

## 2021-01-26 IMAGING — CT CT 3D ACQUISTION WKST
1 series · 1 of 1 positions shown · non-contrast
Comparison: Radiographs same date.

CLINICAL DATA: Fall on ice.  Proximal left humeral fracture.

EXAM:
CT OF THE UPPER LEFT EXTREMITY WITHOUT CONTRAST
3-DIMENSIONAL CT IMAGE RENDERING ON ACQUISITION WORKSTATION
TECHNIQUE: Multidetector CT imaging of the left shoulder was performed
according to the standard protocol. 3-dimensional CT images were
rendered by post-processing of the original CT data on an
acquisition workstation. The 3-dimensional CT images were
interpreted and findings were reported in the accompanying complete
CT report for this study

[Series 1: topogram 1.0 tr20 · coronal · 2.00mm/px · 1 of 1 slices shown]
[im 1/1]
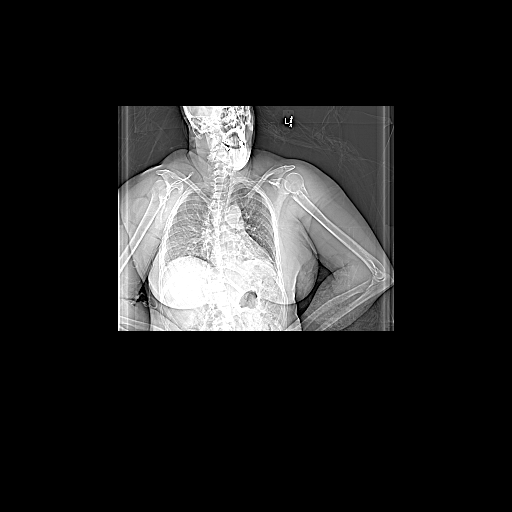

[1 of 1 positions shown; findings below may reference images not displayed]

FINDINGS: Bones/Joint/Cartilage

There is a mildly comminuted fracture of the proximal left humeral
metadiaphysis. This fracture is associated with mild impaction,
anterior displacement by up to 1.4 cm and apex anterior angulation.
There is no involvement of the humeral head or tuberosities.
Glenohumeral joint is intact. The visualized left scapula and
clavicle are intact.

Ligaments

Suboptimally assessed by CT.

Muscles and Tendons

Unremarkable.  No gross rotator cuff impingement.

Soft tissues

Mild anterior soft tissue swelling without focal hematoma, foreign
body or soft tissue emphysema. There is a partially imaged left
breast implant.
IMPRESSION: 1. Mildly comminuted and displaced fracture of the proximal left
humeral metadiaphysis as described.
2. No evidence of dislocation or glenoid fracture.

## 2021-01-26 IMAGING — CR DG SHOULDER 2+V*L*
2 series · 2 of 2 positions shown · non-contrast
Comparison: None.

CLINICAL DATA: Recent slip and fall with shoulder pain, initial
encounter

EXAM:
LEFT SHOULDER - 2+ VIEW

[x shoulder ap left (1 of 2)]
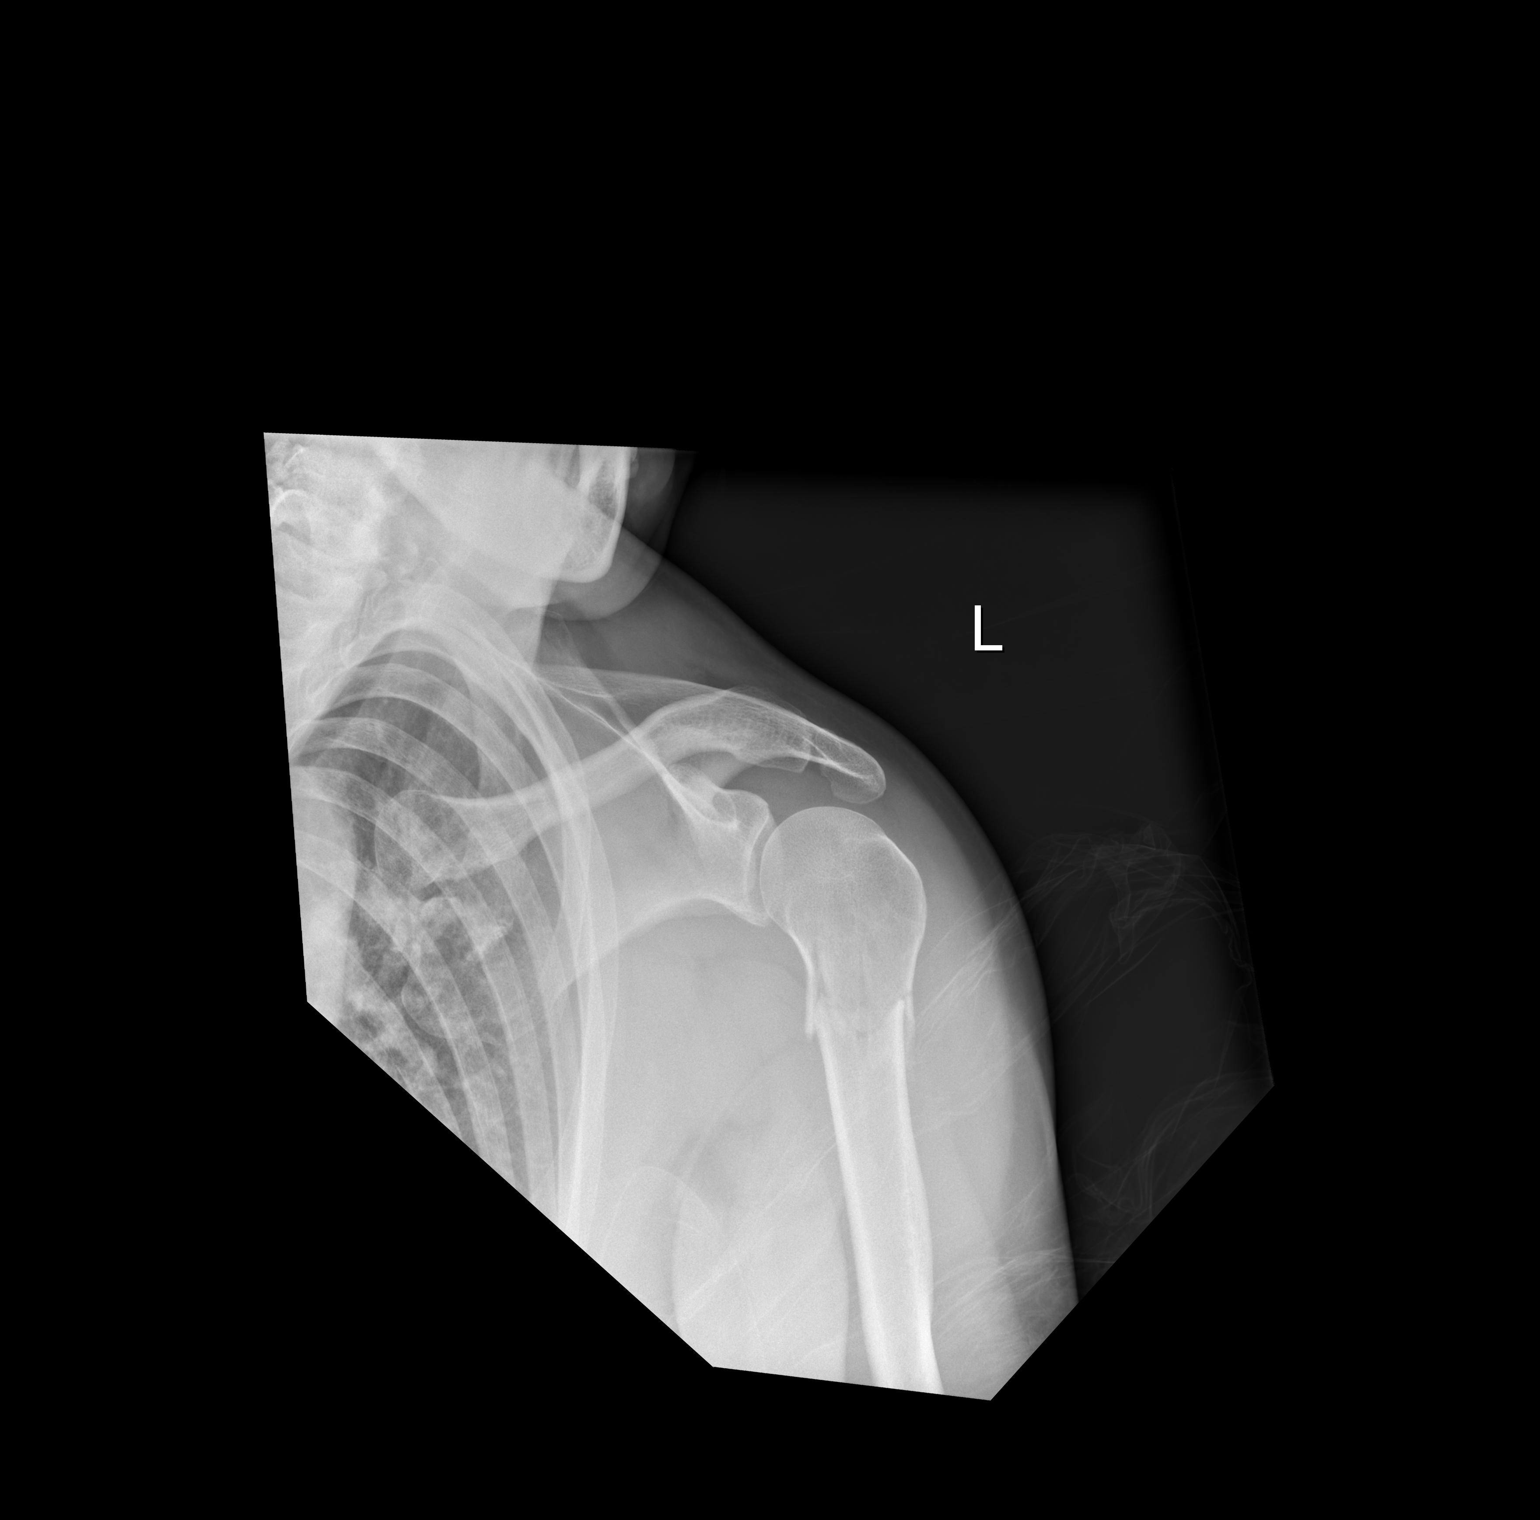

[x shoulder ap left (2 of 2)]
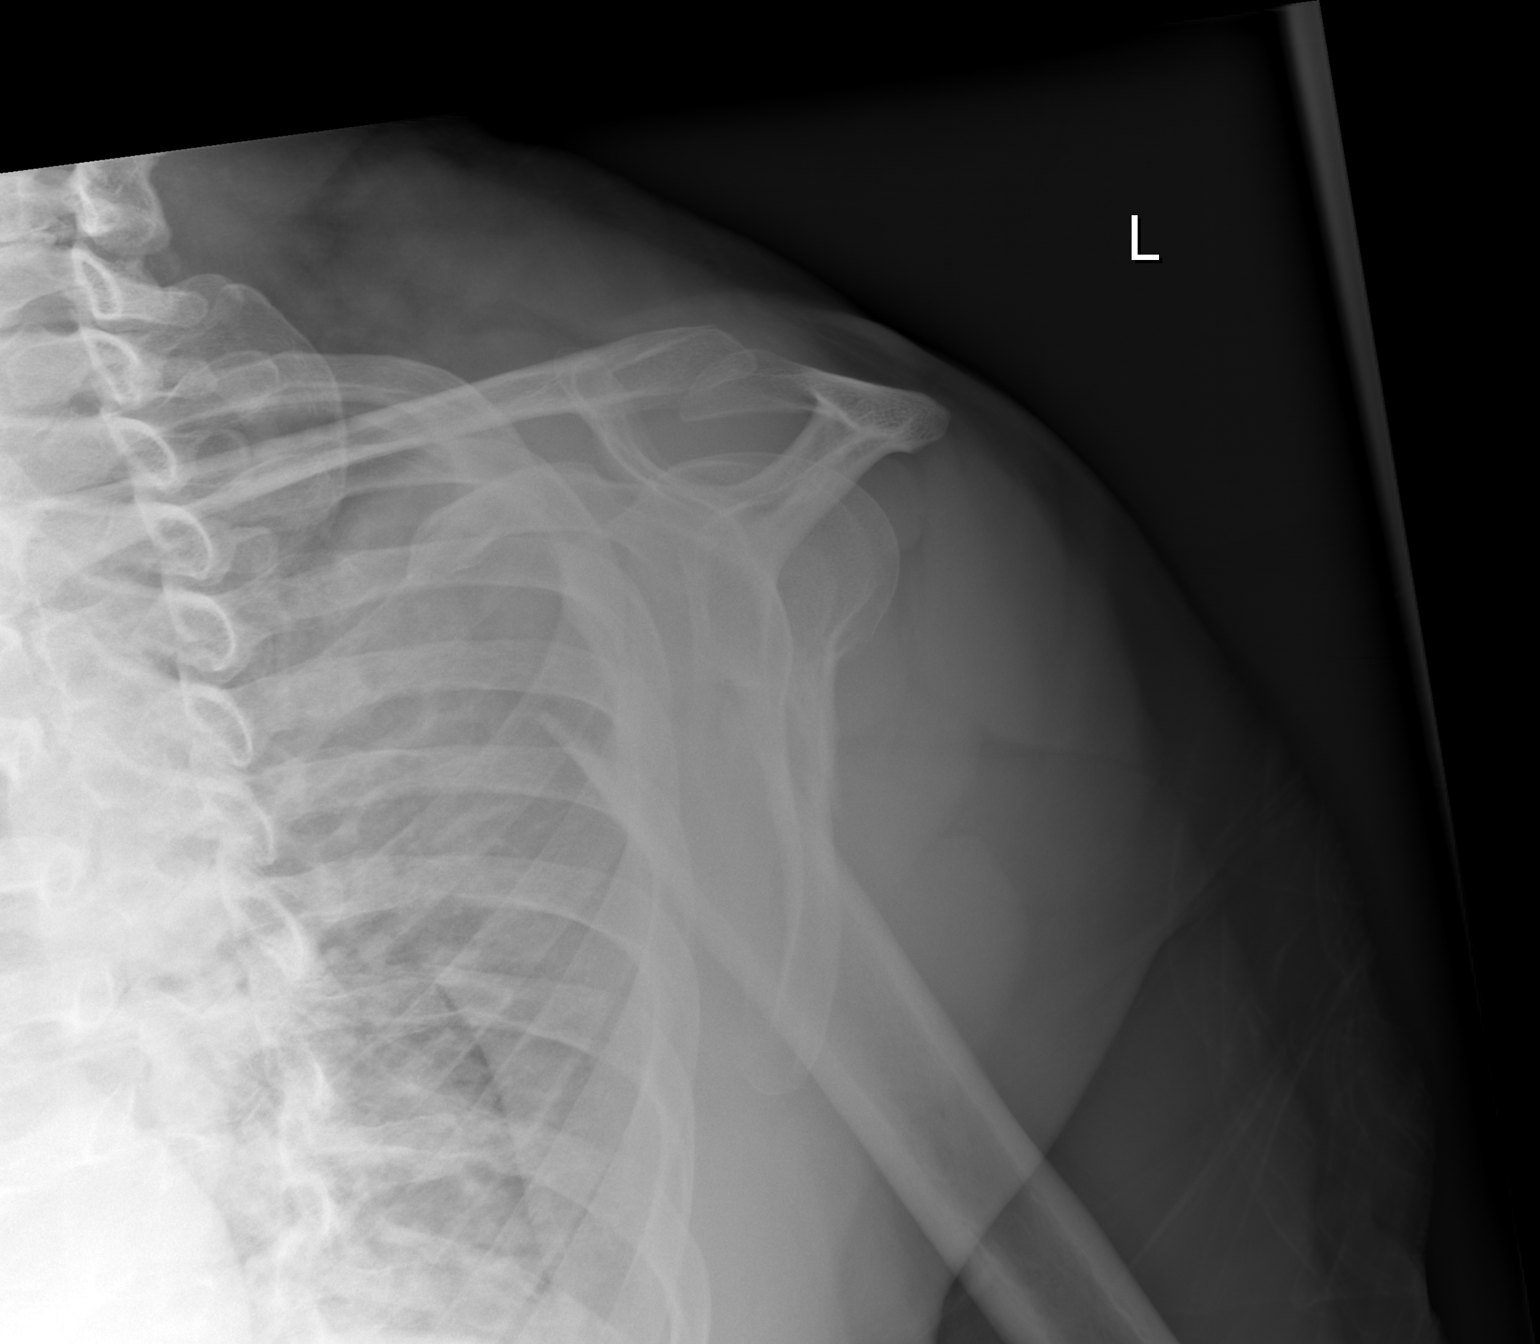

[2 of 2 positions shown; findings below may reference images not displayed]

FINDINGS: Humeral head is well seated. There is however a mildly impacted
fracture in the proximal humeral shaft with angulation at the
fracture site. No other bony abnormality is noted.
IMPRESSION: Impacted and mildly angulated fracture in the proximal humeral
metaphysis.

## 2021-01-26 IMAGING — CR DG RIBS W/ CHEST 3+V*L*
5 series · 5 of 5 positions shown · non-contrast
Comparison: None.

CLINICAL DATA: Recent fall with left chest pain, initial encounter

EXAM:
LEFT RIBS AND CHEST - 3+ VIEW

[x chest ap (1 of 5)]
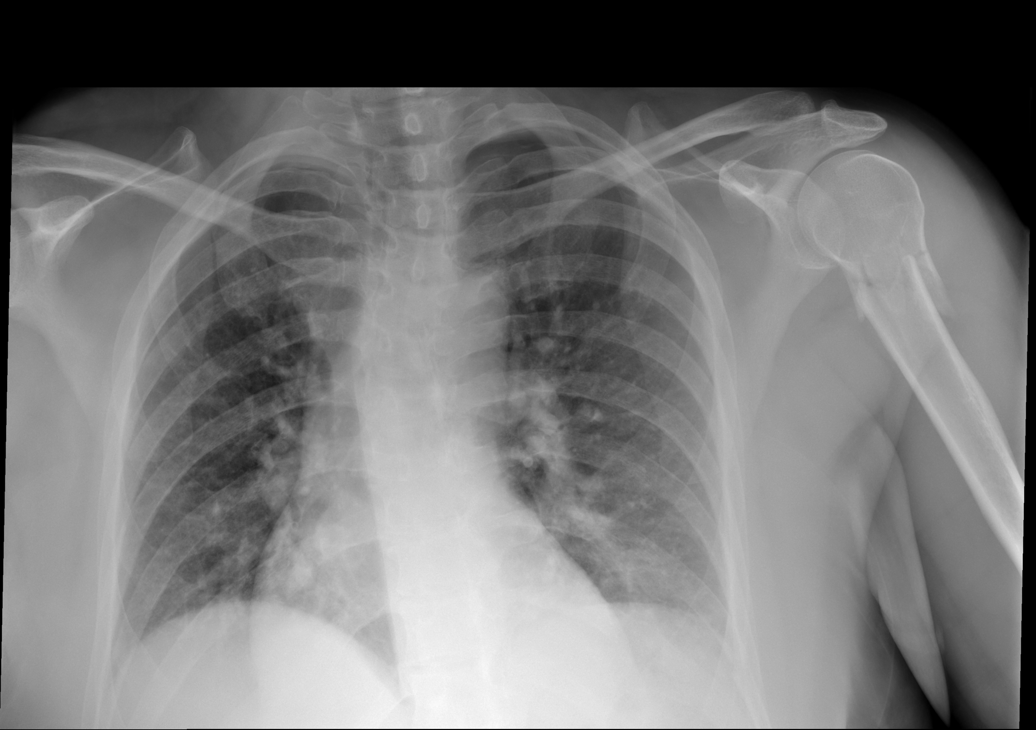

[x chest ap (2 of 5)]
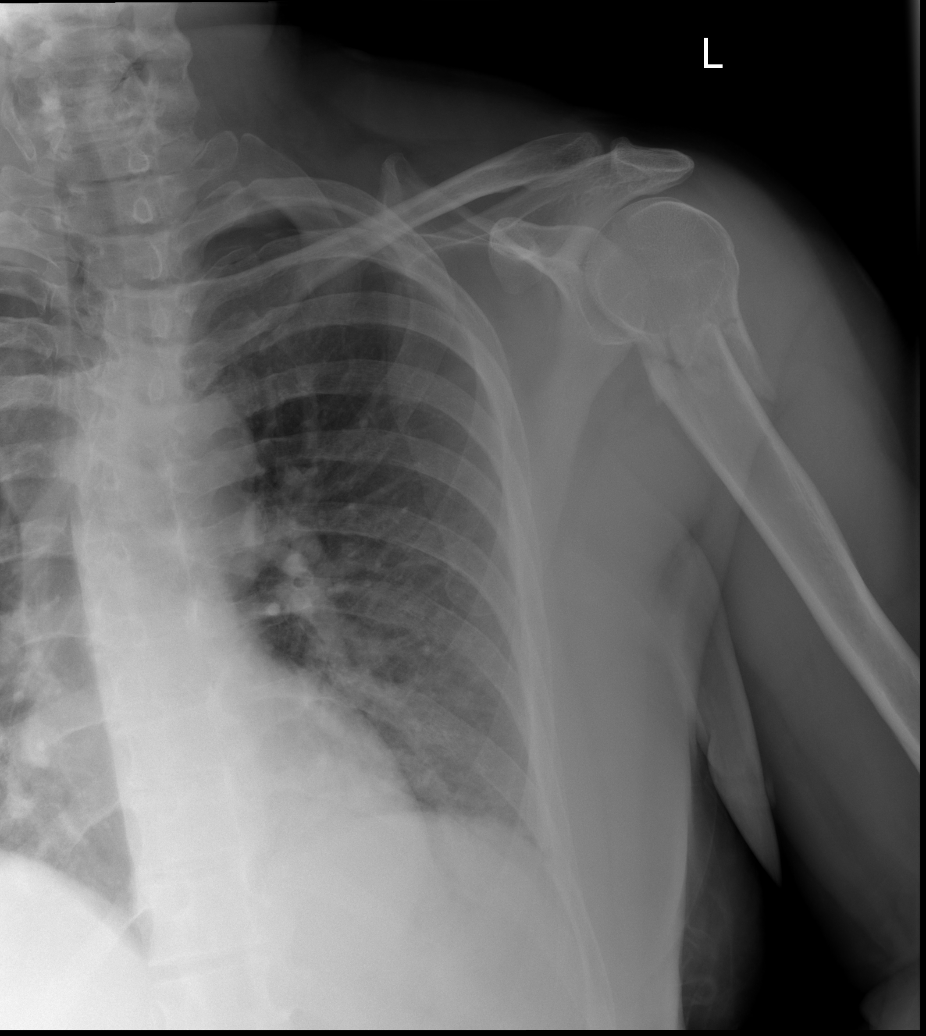

[x chest ap (3 of 5)]
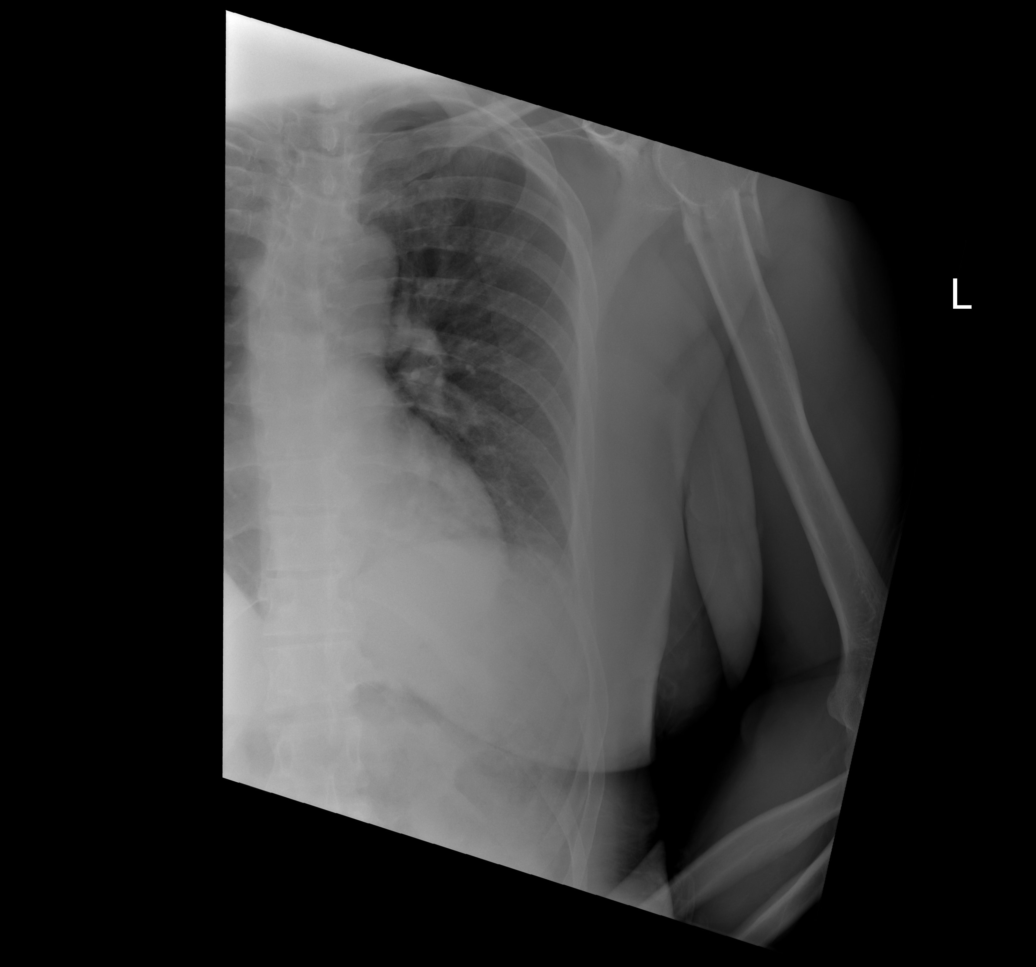

[x chest ap (4 of 5)]
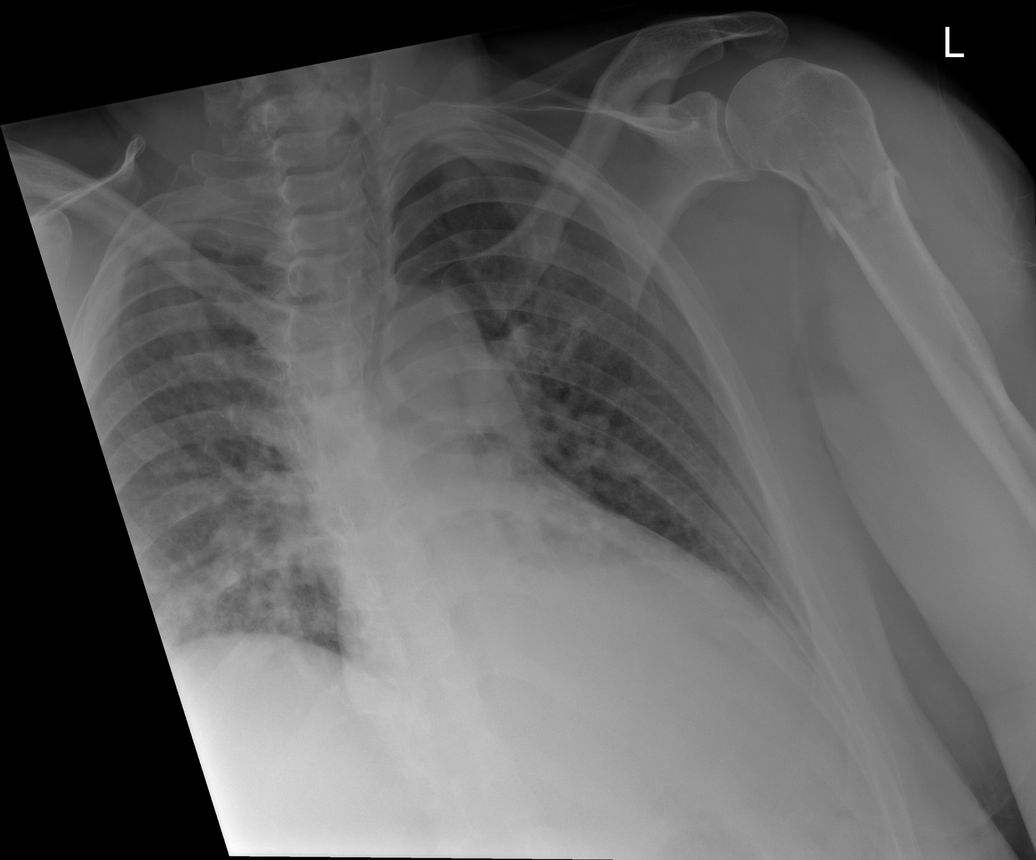

[x chest ap (5 of 5)]
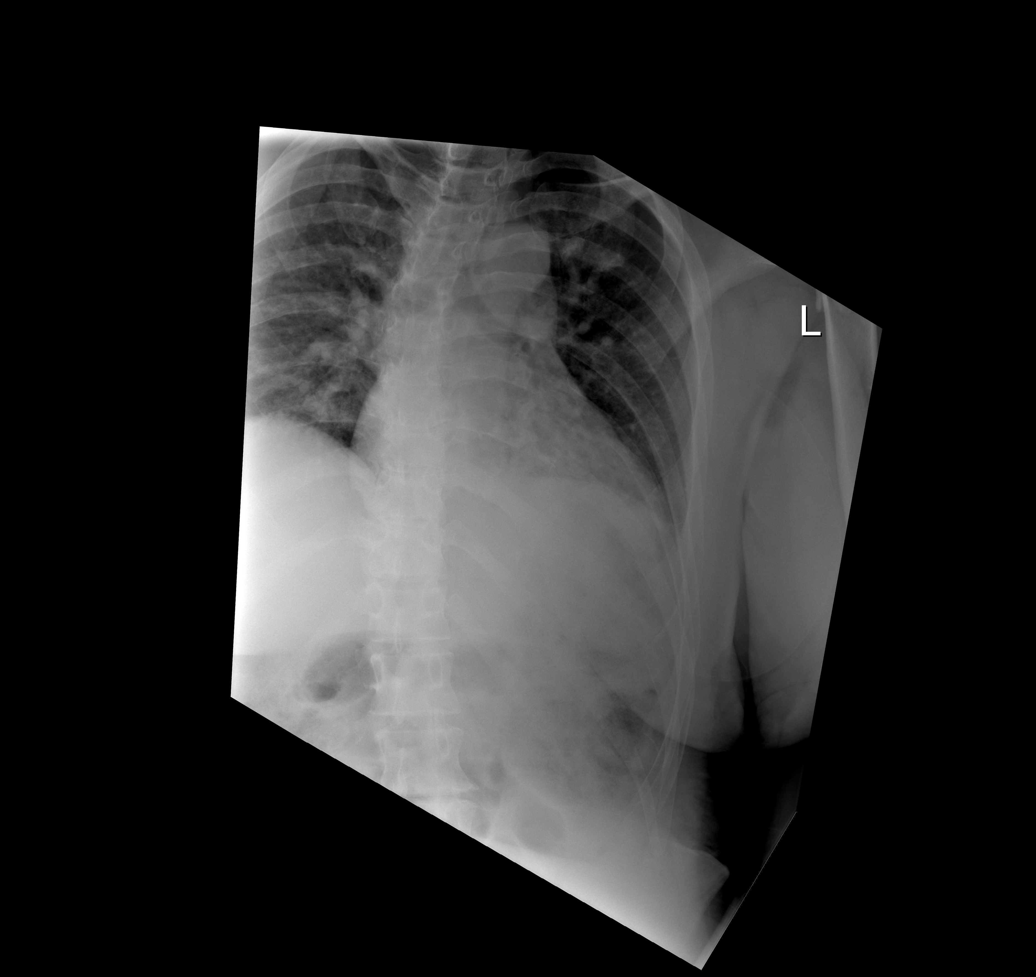

[5 of 5 positions shown; findings below may reference images not displayed]

FINDINGS: Known left humeral fracture is again seen. Cardiac shadow is within
normal limits. The lungs are well aerated. No acute rib fracture is
noted.
IMPRESSION: No rib fracture noted.  Stable left humeral fracture is seen.

## 2021-01-26 IMAGING — CT CT SHOULDER*L* W/O CM
5 of 7 series · 14 of 33 positions shown, 15 images · non-contrast
Comparison: Radiographs same date.

CLINICAL DATA: Fall on ice.  Proximal left humeral fracture.

EXAM:
CT OF THE UPPER LEFT EXTREMITY WITHOUT CONTRAST
3-DIMENSIONAL CT IMAGE RENDERING ON ACQUISITION WORKSTATION
TECHNIQUE: Multidetector CT imaging of the left shoulder was performed
according to the standard protocol. 3-dimensional CT images were
rendered by post-processing of the original CT data on an
acquisition workstation. The 3-dimensional CT images were
interpreted and findings were reported in the accompanying complete
CT report for this study

[Series 5: soft tissue · axial · 0.46mm/px · z∈[+227,+317]mm · 3 of 91 slices shown]
[im 23/91  soft-tissue]
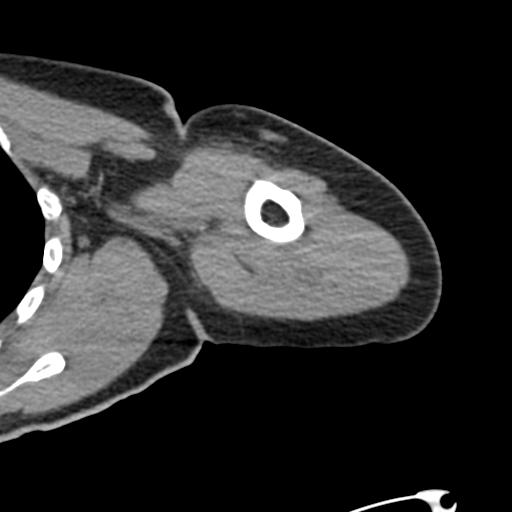
[im 46/91  soft-tissue]
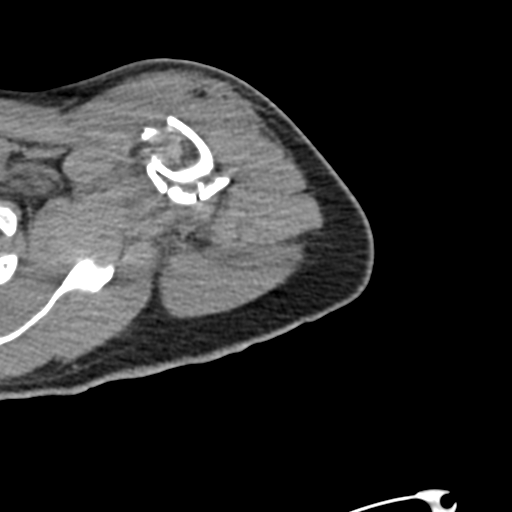
[im 68/91  soft-tissue]
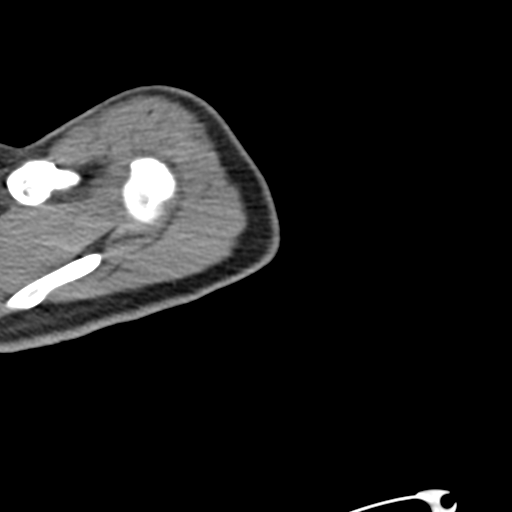

[Series 602: coronal oblique · coronal · 0.46mm/px · 1 of 95 slices shown]
[im 48/95  bone]
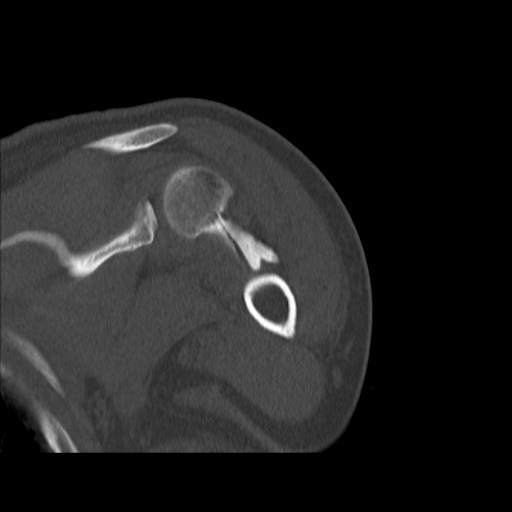

[Series 603: sagittal oblique · sagittal · 0.46mm/px · 5 of 95 slices shown]
[im 16/95  bone]
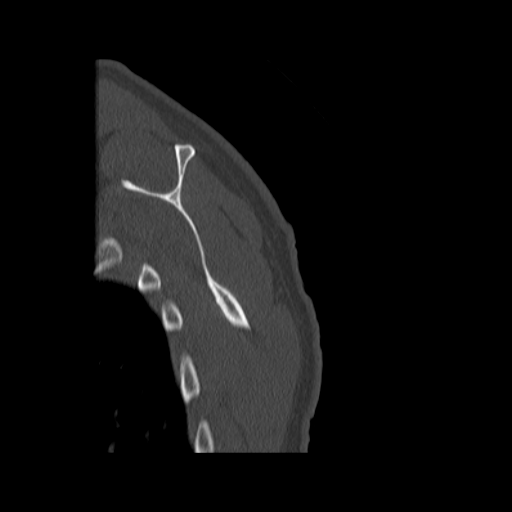
[im 32/95  bone]
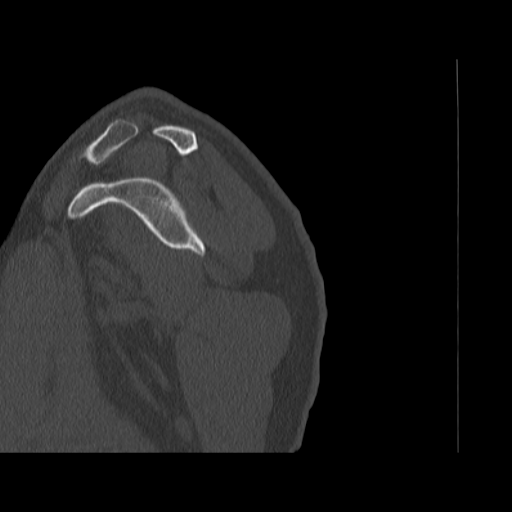
[im 48/95  bone]
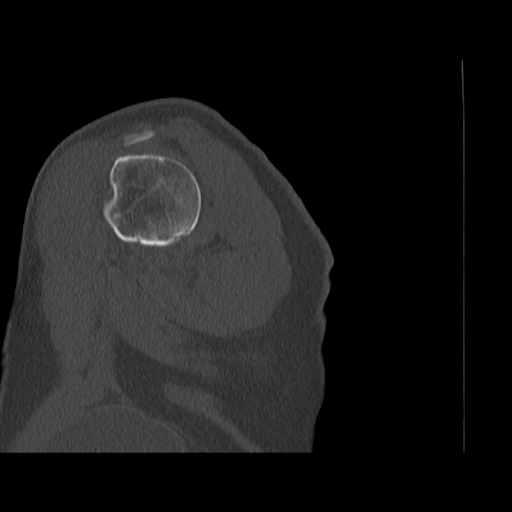
[im 63/95  bone]
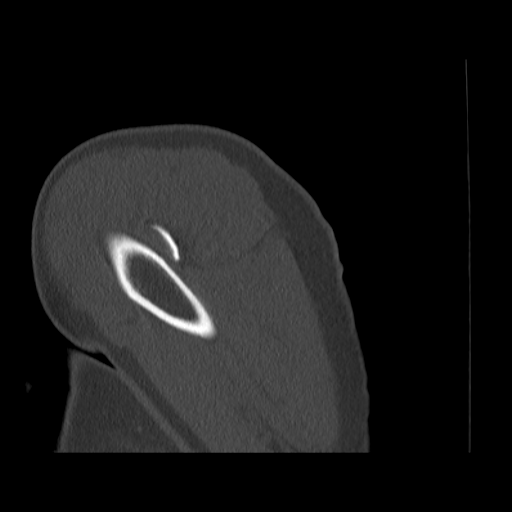
[im 79/95  bone]
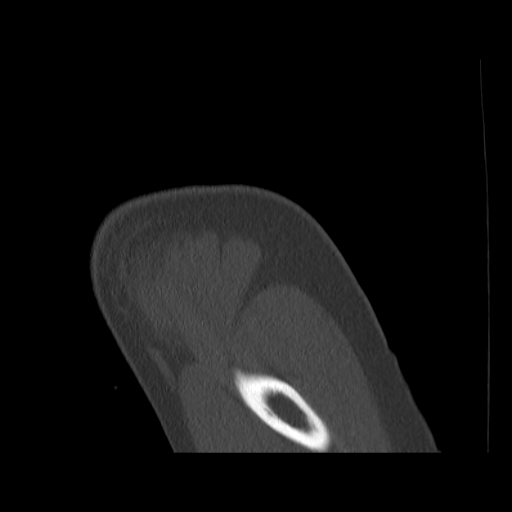

[Series 604: axial bone · axial · 0.46mm/px · z∈[+229,+317]mm · 3 of 90 slices shown, 4 images]
[im 23/90  soft-tissue]
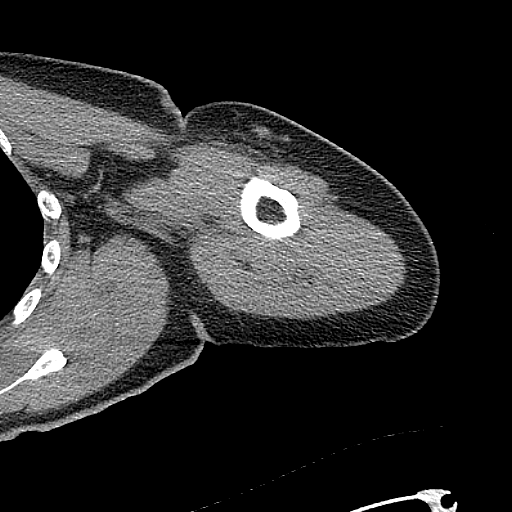
[im 23/90  bone]
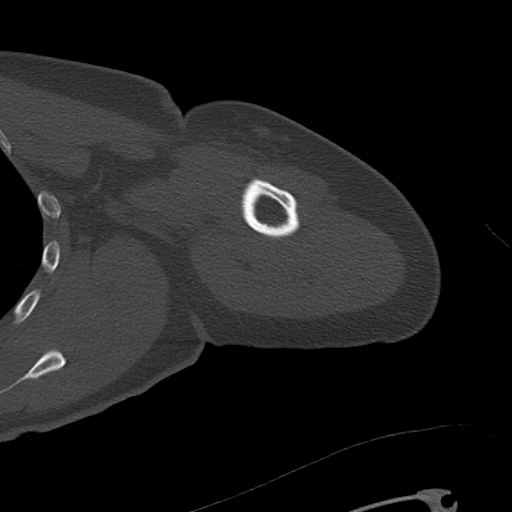
[im 45/90  bone]
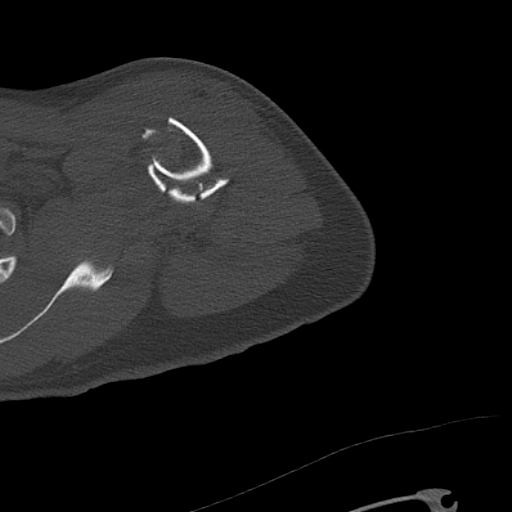
[im 67/90  bone]
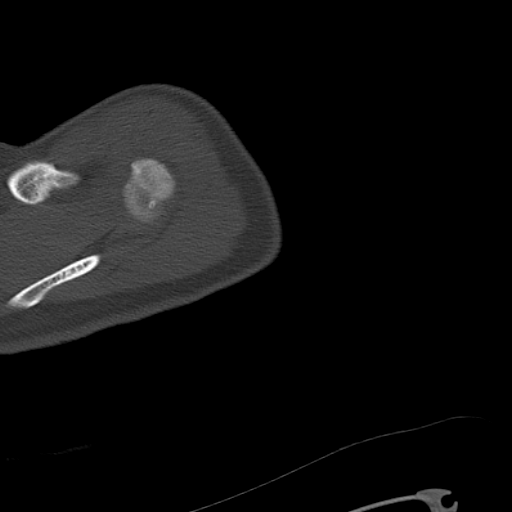

[Series 607: axial st · axial · 0.46mm/px · z∈[+235,+291]mm · 2 of 84 slices shown]
[im 28/84  bone]
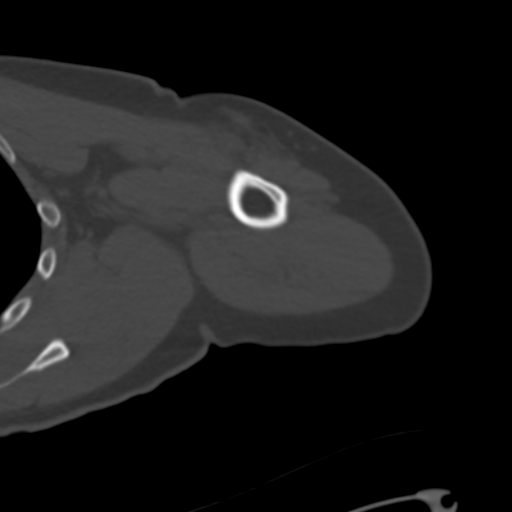
[im 56/84  bone]
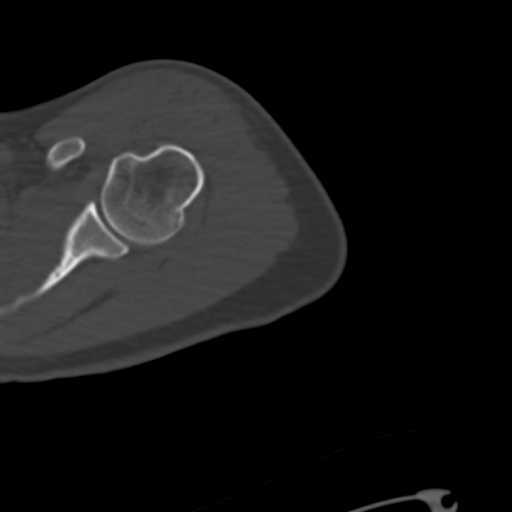

[14 of 33 positions shown; findings below may reference images not displayed]

FINDINGS: Bones/Joint/Cartilage

There is a mildly comminuted fracture of the proximal left humeral
metadiaphysis. This fracture is associated with mild impaction,
anterior displacement by up to 1.4 cm and apex anterior angulation.
There is no involvement of the humeral head or tuberosities.
Glenohumeral joint is intact. The visualized left scapula and
clavicle are intact.

Ligaments

Suboptimally assessed by CT.

Muscles and Tendons

Unremarkable.  No gross rotator cuff impingement.

Soft tissues

Mild anterior soft tissue swelling without focal hematoma, foreign
body or soft tissue emphysema. There is a partially imaged left
breast implant.
IMPRESSION: 1. Mildly comminuted and displaced fracture of the proximal left
humeral metadiaphysis as described.
2. No evidence of dislocation or glenoid fracture.

## 2021-01-26 MED ORDER — ONDANSETRON HCL 4 MG/2ML IJ SOLN
4.0000 mg | Freq: Once | INTRAMUSCULAR | Status: AC
  Administered 2021-01-26: 4 mg via INTRAVENOUS
  Filled 2021-01-26: qty 2

## 2021-01-26 MED ORDER — IBUPROFEN 600 MG PO TABS: 600.0000 mg | ORAL_TABLET | Freq: Four times a day (QID) | ORAL | 0 refills | Status: DC | PRN

## 2021-01-26 MED ORDER — DIAZEPAM 5 MG PO TABS: 5.0000 mg | ORAL_TABLET | Freq: Two times a day (BID) | ORAL | 0 refills | Status: DC

## 2021-01-26 MED ORDER — MORPHINE SULFATE (PF) 4 MG/ML IV SOLN
4.0000 mg | Freq: Once | INTRAVENOUS | Status: AC
  Administered 2021-01-26: 4 mg via INTRAVENOUS
  Filled 2021-01-26: qty 1

## 2021-01-26 MED ORDER — HYDROCODONE-ACETAMINOPHEN 5-325 MG PO TABS: 1.0000 | ORAL_TABLET | ORAL | 0 refills | Status: DC | PRN

## 2021-01-26 MED ORDER — LORAZEPAM 2 MG/ML IJ SOLN
1.0000 mg | Freq: Once | INTRAMUSCULAR | Status: AC
  Administered 2021-01-26: 1 mg via INTRAVENOUS
  Filled 2021-01-26: qty 1

## 2021-01-26 NOTE — ED Triage Notes (Signed)
Per EMS- patient reports she slipped on ice and fell onto brick. Patient c/o left shoulder pain and is deformed, left flank pain, and left wrist pain. Patient refused IV and meds prior to arrival to the ED. Patient stated she wanted to wait until she was seen.  No LOC, no hitting of her head and no blood thinners.

## 2021-01-26 NOTE — Consult Note (Signed)
ORTHOPAEDIC CONSULTATION  REQUESTING PHYSICIAN: Isla Pence, MD  Chief Complaint: left proximal humerus fracture  HPI: Betty Lucas is a 55 y.o. female with past medical history significant for DVT after foot surgery and IBS. She presents to [facility] today after a fall. She slipped on ice and hit her left arm on concrete. She had immediate pain in the left arm. She is left handed and works as a Emergency planning/management officer. Only previous orthopedic history is a left foot fracture and left foot surgery. She had a DVT after her left foot surgery. Her husband is at the bedside  She is having pain in her left upper extremity and ribs. Controlled with pain medications.   Past Medical History:  Diagnosis Date  . Actinic keratosis 04/17/2012  . History of deep vein thrombosis (DVT) of lower extremity 10/01/2014   Rt after fracture around 2011  . IBS (irritable bowel syndrome) 06/06/2017   Past Surgical History:  Procedure Laterality Date  . BREAST ENHANCEMENT SURGERY    . FOOT SURGERY    . OVARIAN CYST SURGERY     Social History   Socioeconomic History  . Marital status: Married    Spouse name: Not on file  . Number of children: Not on file  . Years of education: Not on file  . Highest education level: Not on file  Occupational History  . Not on file  Tobacco Use  . Smoking status: Never Smoker  . Smokeless tobacco: Never Used  Substance and Sexual Activity  . Alcohol use: Yes  . Drug use: No  . Sexual activity: Not Currently  Other Topics Concern  . Not on file  Social History Narrative  . Not on file   Social Determinants of Health   Financial Resource Strain: Not on file  Food Insecurity: Not on file  Transportation Needs: Not on file  Physical Activity: Not on file  Stress: Not on file  Social Connections: Not on file   Family History  Problem Relation Age of Onset  . Hyperlipidemia Mother   . Cancer Father   . Cancer Paternal Aunt    No  Known Allergies Prior to Admission medications   Medication Sig Start Date End Date Taking? Authorizing Provider  cholecalciferol (VITAMIN D3) 25 MCG (1000 UNIT) tablet Take 1,000 Units by mouth daily.   Yes [provider]  diazepam (VALIUM) 5 MG tablet Take 1 tablet (5 mg total) by mouth 2 (two) times daily. 01/26/21  Yes Isla Pence, MD  HYDROcodone-acetaminophen (NORCO/VICODIN) 5-325 MG tablet Take 1 tablet by mouth every 4 (four) hours as needed. 01/26/21  Yes Isla Pence, MD  ibuprofen (ADVIL) 600 MG tablet Take 1 tablet (600 mg total) by mouth every 6 (six) hours as needed. 01/26/21  Yes Isla Pence, MD  naproxen sodium (ALEVE) 220 MG tablet Take 220 mg by mouth daily as needed (pain).   Yes [provider]  phentermine (ADIPEX-P) 37.5 MG tablet Take 1.75 tablets by mouth daily. 10/29/20  Yes [provider]  Probiotic Product (PROBIOTIC DAILY PO) Take 1 tablet by mouth daily.   Yes [provider]  QUERCETIN PO Take 1 tablet by mouth daily.   Yes [provider]  zinc gluconate 50 MG tablet Take 50 mg by mouth daily.   Yes [provider]  lubiprostone (AMITIZA) 24 MCG capsule TAKE 1 CAPSULE BY MOUTH 2 (TWO) TIMES DAILY WITH A MEAL. Patient not taking: No sig reported 07/30/19  Corey, Evan S, MD  valACYclovir (VALTREX) 1000 MG tablet Take 2,000mg (2 tablets) twice daily for 1 day at start of cold sore symptoms as needed. Patient not taking: No sig reported 11/10/20   Jessup, Joy, NP   DG Ribs Unilateral W/Chest Left  Result Date: 01/26/2021 CLINICAL DATA:  Recent fall with left chest pain, initial encounter EXAM: LEFT RIBS AND CHEST - 3+ VIEW COMPARISON:  None. FINDINGS: Known left humeral fracture is again seen. Cardiac shadow is within normal limits. The lungs are well aerated. No acute rib fracture is noted. IMPRESSION: No rib fracture noted.  Stable left humeral fracture is seen. Electronically Signed   By: Mark  Lukens M.D.    On: 01/26/2021 12:59   DG Wrist Complete Left  Result Date: 01/26/2021 CLINICAL DATA:  Recent fall with known left humeral fracture and wrist pain, initial encounter EXAM: LEFT WRIST - COMPLETE 3+ VIEW COMPARISON:  None. FINDINGS: There is no evidence of fracture or dislocation. There is no evidence of arthropathy or other focal bone abnormality. Soft tissues are unremarkable. IMPRESSION: No acute abnormality noted. Electronically Signed   By: Mark  Lukens M.D.   On: 01/26/2021 13:00   DG Shoulder Left  Result Date: 01/26/2021 CLINICAL DATA:  Recent slip and fall with shoulder pain, initial encounter EXAM: LEFT SHOULDER - 2+ VIEW COMPARISON:  None. FINDINGS: Humeral head is well seated. There is however a mildly impacted fracture in the proximal humeral shaft with angulation at the fracture site. No other bony abnormality is noted. IMPRESSION: Impacted and mildly angulated fracture in the proximal humeral metaphysis. Electronically Signed   By: Mark  Lukens M.D.   On: 01/26/2021 12:58   Family History Reviewed and non-contributory, no pertinent history of problems with bleeding or anesthesia      Review of Systems 14 system ROS conducted and negative except for that noted in HPI   OBJECTIVE  Vitals: Patient Vitals for the past 8 hrs:  BP Temp Temp src Pulse Resp SpO2 Height Weight  01/26/21 1415 110/70 - - 74 16 100 % - -  01/26/21 1345 108/68 - - 72 14 99 % - -  01/26/21 1315 108/65 - - 67 16 100 % - -  01/26/21 1300 109/68 - - 66 16 100 % - -  01/26/21 1240 113/70 (!) 97.5 F (36.4 C) Oral 65 14 100 % - -  01/26/21 1118 - - - - - - 5' 6" (1.676 m) 77.1 kg  01/26/21 1110 129/72 97.7 F (36.5 C) Oral 82 18 95 % - -   General: Alert, no acute distress Cardiovascular: Warm extremities noted Respiratory: No cyanosis, no use of accessory musculature GI: No organomegaly, abdomen is soft and non-tender Skin: No lesions in the area of chief complaint other than those listed below in MSK  exam.  Neurologic: Sensation intact distally save for the below mentioned MSK exam Psychiatric: Patient is competent for consent with normal mood and affect Lymphatic: No swelling obvious and reported other than the area involved in the exam below  Extremities  RUE: actively moves RUE without pain. NVI LUE: swelling of left upper extremity. Tender to palpation proximal humerus. Axillary nerve is firing. ROM not tested in setting of known fracture. Contusion of left wrist. Bandage in place.  Sensation intact in medial, radial, and ulnar distributions. She is able to wiggle her fingers. Well perfused digits.  BLE: actively moves bilateral lower extremity without pain. NVI.    Test Results Imaging Xrays of left   humerus show impacted and angulated proximal humerus fracture  Xrays of left wrist and ribs show no acute bony abnormalities.   Labs cbc No results for input(s): WBC, HGB, HCT, PLT in the last 72 hours.  Labs inflam No results for input(s): CRP in the last 72 hours.  Invalid input(s): ESR  Labs coag No results for input(s): INR, PTT in the last 72 hours.  Invalid input(s): PT  No results for input(s): NA, K, CL, CO2, GLUCOSE, BUN, CREATININE, CALCIUM in the last 72 hours.   ASSESSMENT AND PLAN: 55 y.o. female with the following: left proximal humerus fracture  The risks benefits and alternatives were discussed with the patient including but not limited to the risks of nonoperative treatment, versus surgical intervention including infection, bleeding, nerve injury,  blood clots, cardiopulmonary complications, morbidity, mortality, among others, and patient was willing to proceed.   We additionally specifically discussed risks of axillary nerve injury, infection, periprosthetic fracture, continued pain and longevity of implants prior to beginning procedure.   - Plan: ORIF left proximal humerus fracture on Wednesday, 01/28/2021, with Dr. Griffin Basil - Will be outpatient  procedure - Okay to be discharged home today  - Weight Bearing Status/Activity: NWB LUE, sling - Additional recommended labs/tests: CT left shoulder, COVID test - Follow-up plan: Surgery scheduled for Wednesday, will follow-up in office 1 week post-op  Patient and her husband are in agreement with the plan. All of their questions were answered at length.   Noemi Chapel, PA-C 01/26/2021

## 2021-01-26 NOTE — H&P (View-Only) (Signed)
   ORTHOPAEDIC CONSULTATION  REQUESTING PHYSICIAN: Haviland, Julie, MD  Chief Complaint: left proximal humerus fracture  HPI: Betty Lucas is a 54 y.o. female with past medical history significant for DVT after foot surgery and IBS. She presents to [facility] today after a fall. She slipped on ice and hit her left arm on concrete. She had immediate pain in the left arm. 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 Only previous orthopedic history is a left foot fracture and left foot surgery. She had a DVT after her left foot surgery. Her husband is at the bedside  She is having pain in her left upper extremity and ribs. Controlled with pain medications.   Past Medical History:  Diagnosis Date  . Actinic keratosis 04/17/2012  . History of deep vein thrombosis (DVT) of lower extremity 10/01/2014   Rt after fracture around 2011  . IBS (irritable bowel syndrome) 06/06/2017   Past Surgical History:  Procedure Laterality Date  . BREAST ENHANCEMENT SURGERY    . FOOT SURGERY    . OVARIAN CYST SURGERY     Social History   Socioeconomic History  . Marital status: Married    Spouse name: Not on file  . Number of children: Not on file  . Years of education: Not on file  . Highest education level: Not on file  Occupational History  . Not on file  Tobacco Use  . Smoking status: Never Smoker  . Smokeless tobacco: Never Used  Substance and Sexual Activity  . Alcohol use: Yes  . Drug use: No  . Sexual activity: Not Currently  Other Topics Concern  . Not on file  Social History Narrative  . Not on file   Social Determinants of Health   Financial Resource Strain: Not on file  Food Insecurity: Not on file  Transportation Needs: Not on file  Physical Activity: Not on file  Stress: Not on file  Social Connections: Not on file   Family History  Problem Relation Age of Onset  . Hyperlipidemia Mother   . Cancer Father   . Cancer Paternal Aunt    No  Known Allergies Prior to Admission medications   Medication Sig Start Date End Date Taking? Authorizing Provider  cholecalciferol (VITAMIN D3) 25 MCG (1000 UNIT) tablet Take 1,000 Units by mouth daily.   Yes [provider]  diazepam (VALIUM) 5 MG tablet Take 1 tablet (5 mg total) by mouth 2 (two) times daily. 01/26/21  Yes Haviland, Julie, MD  HYDROcodone-acetaminophen (NORCO/VICODIN) 5-325 MG tablet Take 1 tablet by mouth every 4 (four) hours as needed. 01/26/21  Yes Haviland, Julie, MD  ibuprofen (ADVIL) 600 MG tablet Take 1 tablet (600 mg total) by mouth every 6 (six) hours as needed. 01/26/21  Yes Haviland, Julie, MD  naproxen sodium (ALEVE) 220 MG tablet Take 220 mg by mouth daily as needed (pain).   Yes [provider]  phentermine (ADIPEX-P) 37.5 MG tablet Take 1.75 tablets by mouth daily. 10/29/20  Yes [provider]  Probiotic Product (PROBIOTIC DAILY PO) Take 1 tablet by mouth daily.   Yes [provider]  QUERCETIN PO Take 1 tablet by mouth daily.   Yes [provider]  zinc gluconate 50 MG tablet Take 50 mg by mouth daily.   Yes [provider]  lubiprostone (AMITIZA) 24 MCG capsule TAKE 1 CAPSULE BY MOUTH 2 (TWO) TIMES DAILY WITH A MEAL. Patient not taking: No sig reported 07/30/19     Corey, Evan S, MD  valACYclovir (VALTREX) 1000 MG tablet Take 2,000mg (2 tablets) twice daily for 1 day at start of cold sore symptoms as needed. Patient not taking: No sig reported 11/10/20   Jessup, Joy, NP   DG Ribs Unilateral W/Chest Left  Result Date: 01/26/2021 CLINICAL DATA:  Recent fall with left chest pain, initial encounter EXAM: LEFT RIBS AND CHEST - 3+ VIEW COMPARISON:  None. FINDINGS: Known left humeral fracture is again seen. Cardiac shadow is within normal limits. The lungs are well aerated. No acute rib fracture is noted. IMPRESSION: No rib fracture noted.  Stable left humeral fracture is seen. Electronically Signed   By: Mark  Lukens M.D.    On: 01/26/2021 12:59   DG Wrist Complete Left  Result Date: 01/26/2021 CLINICAL DATA:  Recent fall with known left humeral fracture and wrist pain, initial encounter EXAM: LEFT WRIST - COMPLETE 3+ VIEW COMPARISON:  None. FINDINGS: There is no evidence of fracture or dislocation. There is no evidence of arthropathy or other focal bone abnormality. Soft tissues are unremarkable. IMPRESSION: No acute abnormality noted. Electronically Signed   By: Mark  Lukens M.D.   On: 01/26/2021 13:00   DG Shoulder Left  Result Date: 01/26/2021 CLINICAL DATA:  Recent slip and fall with shoulder pain, initial encounter EXAM: LEFT SHOULDER - 2+ VIEW COMPARISON:  None. FINDINGS: Humeral head is well seated. There is however a mildly impacted fracture in the proximal humeral shaft with angulation at the fracture site. No other bony abnormality is noted. IMPRESSION: Impacted and mildly angulated fracture in the proximal humeral metaphysis. Electronically Signed   By: Mark  Lukens M.D.   On: 01/26/2021 12:58   Family History Reviewed and non-contributory, no pertinent history of problems with bleeding or anesthesia      Review of Systems 14 system ROS conducted and negative except for that noted in HPI   OBJECTIVE  Vitals: Patient Vitals for the past 8 hrs:  BP Temp Temp src Pulse Resp SpO2 Height Weight  01/26/21 1415 110/70 - - 74 16 100 % - -  01/26/21 1345 108/68 - - 72 14 99 % - -  01/26/21 1315 108/65 - - 67 16 100 % - -  01/26/21 1300 109/68 - - 66 16 100 % - -  01/26/21 1240 113/70 (!) 97.5 F (36.4 C) Oral 65 14 100 % - -  01/26/21 1118 - - - - - - 5' 6" (1.676 m) 77.1 kg  01/26/21 1110 129/72 97.7 F (36.5 C) Oral 82 18 95 % - -   General: Alert, no acute distress Cardiovascular: Warm extremities noted Respiratory: No cyanosis, no use of accessory musculature GI: No organomegaly, abdomen is soft and non-tender Skin: No lesions in the area of chief complaint other than those listed below in MSK  exam.  Neurologic: Sensation intact distally save for the below mentioned MSK exam Psychiatric: Patient is competent for consent with normal mood and affect Lymphatic: No swelling obvious and reported other than the area involved in the exam below  Extremities  RUE: actively moves RUE without pain. NVI LUE: swelling of left upper extremity. Tender to palpation proximal humerus. Axillary nerve is firing. ROM not tested in setting of known fracture. Contusion of left wrist. Bandage in place.  Sensation intact in medial, radial, and ulnar distributions. She is able to wiggle her fingers. Well perfused digits.  BLE: actively moves bilateral lower extremity without pain. NVI.    Test Results Imaging Xrays of left   humerus show impacted and angulated proximal humerus fracture  Xrays of left wrist and ribs show no acute bony abnormalities.   Labs cbc No results for input(s): WBC, HGB, HCT, PLT in the last 72 hours.  Labs inflam No results for input(s): CRP in the last 72 hours.  Invalid input(s): ESR  Labs coag No results for input(s): INR, PTT in the last 72 hours.  Invalid input(s): PT  No results for input(s): NA, K, CL, CO2, GLUCOSE, BUN, CREATININE, CALCIUM in the last 72 hours.   ASSESSMENT AND PLAN: 54 y.o. female with the following: left proximal humerus fracture  The risks benefits and alternatives were discussed with the patient including but not limited to the risks of nonoperative treatment, versus surgical intervention including infection, bleeding, nerve injury,  blood clots, cardiopulmonary complications, morbidity, mortality, among others, and patient was willing to proceed.   We additionally specifically discussed risks of axillary nerve injury, infection, periprosthetic fracture, continued pain and longevity of implants prior to beginning procedure.   - Plan: ORIF left proximal humerus fracture on Wednesday, 01/28/2021, with Dr. Varkey - Will be outpatient  procedure - Okay to be discharged home today  - Weight Bearing Status/Activity: NWB LUE, sling - Additional recommended labs/tests: CT left shoulder, COVID test - Follow-up plan: Surgery scheduled for Wednesday, will follow-up in office 1 week post-op  Patient and her husband are in agreement with the plan. All of their questions were answered at length.   Chestine Belknap, PA-C 01/26/2021  

## 2021-01-26 NOTE — Progress Notes (Signed)
Orthopedic Tech Progress Note Patient Details:  Betty Lucas 1966-12-16 [id number]  Ortho Devices Type of Ortho Device: Ace wrap,Coapt Ortho Device/Splint Location: LUE applied sling to LUE Ortho Device/Splint Interventions: Ordered,Application   Post Interventions Patient Tolerated: Well Instructions Provided: Care of device   Braulio Bosch 01/26/2021, 3:34 PM

## 2021-01-26 NOTE — ED Provider Notes (Signed)
[place] DEPT Provider Note   CSN: [place]:[id number] Arrival date & time: 01/26/21  1054     History Chief Complaint  Patient presents with  . Fall  . Shoulder Injury  . Wrist Injury  . Flank Pain    Betty Lucas is a 55 y.o. female.  Pt presents to the ED today with left shoulder, left wrist, and left rib pain.  Pt said she slipped on some ice.  She denies hitting her head or having a loc.  Pt is left handed.  She works as a Theme park manager.        Past Medical History:  Diagnosis Date  . Actinic keratosis 04/17/2012  . History of deep vein thrombosis (DVT) of lower extremity 10/01/2014   Rt after fracture around 2011  . IBS (irritable bowel syndrome) 06/06/2017    Patient Active Problem List   Diagnosis Date Noted  . Cervical spondylosis 04/07/2020  . Primary osteoarthritis of left knee 04/07/2020  . GERD (gastroesophageal reflux disease) 10/18/2018  . IBS (irritable bowel syndrome) 06/06/2017  . History of deep vein thrombosis (DVT) of lower extremity 10/01/2014  . Family history of malignant neoplasm of other organs or systems 10/29/2013  . Family history of melanoma 10/29/2013  . Exposure to tanning bed 10/23/2012  . Actinic keratosis 04/17/2012  . Personal history of other malignant neoplasm of skin 04/17/2012  . FINGER PAIN 12/05/2010    Past Surgical History:  Procedure Laterality Date  . BREAST ENHANCEMENT SURGERY    . FOOT SURGERY    . OVARIAN CYST SURGERY       OB History   No obstetric history on file.     Family History  Problem Relation Age of Onset  . Hyperlipidemia Mother   . Cancer Father   . Cancer Paternal Aunt     Social History   Tobacco Use  . Smoking status: Never Smoker  . Smokeless tobacco: Never Used  Substance Use Topics  . Alcohol use: Yes  . Drug use: No    Home Medications Prior to Admission medications   Medication Sig Start Date End Date Taking? Authorizing Provider  cholecalciferol  (VITAMIN D3) 25 MCG (1000 UNIT) tablet Take 1,000 Units by mouth daily.   Yes [provider]  diazepam (VALIUM) 5 MG tablet Take 1 tablet (5 mg total) by mouth 2 (two) times daily. 01/26/21  Yes Isla Pence, MD  HYDROcodone-acetaminophen (NORCO/VICODIN) 5-325 MG tablet Take 1 tablet by mouth every 4 (four) hours as needed. 01/26/21  Yes Isla Pence, MD  ibuprofen (ADVIL) 600 MG tablet Take 1 tablet (600 mg total) by mouth every 6 (six) hours as needed. 01/26/21  Yes Isla Pence, MD  naproxen sodium (ALEVE) 220 MG tablet Take 220 mg by mouth daily as needed (pain).   Yes [provider]  phentermine (ADIPEX-P) 37.5 MG tablet Take 1.75 tablets by mouth daily. 10/29/20  Yes [provider]  Probiotic Product (PROBIOTIC DAILY PO) Take 1 tablet by mouth daily.   Yes [provider]  QUERCETIN PO Take 1 tablet by mouth daily.   Yes [provider]  zinc gluconate 50 MG tablet Take 50 mg by mouth daily.   Yes [provider]  lubiprostone (AMITIZA) 24 MCG capsule TAKE 1 CAPSULE BY MOUTH 2 (TWO) TIMES DAILY WITH A MEAL. Patient not taking: No sig reported 07/30/19   Gregor Hams, MD  valACYclovir (VALTREX) 1000 MG tablet Take 2,000mg  (2 tablets) twice daily for 1  day at start of cold sore symptoms as needed. Patient not taking: No sig reported 11/10/20   Samuel Bouche, NP    Allergies    Patient has no known allergies.  Review of Systems   Review of Systems  Musculoskeletal:       Left shoulder, left wrist, left rib pain  All other systems reviewed and are negative.   Physical Exam Updated Vital Signs BP 106/66   Pulse 70   Temp (!) 97.5 F (36.4 C) (Oral)   Resp 14   Ht 5\' 6"  (1.676 m)   Wt 77.1 kg   SpO2 99%   BMI 27.44 kg/m   Physical Exam Vitals and nursing note reviewed.  Constitutional:      General: She is in acute distress.     Appearance: Normal appearance.  HENT:     Head: Normocephalic and atraumatic.     Right  Ear: External ear normal.     Left Ear: External ear normal.     Nose: Nose normal.     Mouth/Throat:     Mouth: Mucous membranes are moist.     Pharynx: Oropharynx is clear.  Eyes:     Extraocular Movements: Extraocular movements intact.     Conjunctiva/sclera: Conjunctivae normal.     Pupils: Pupils are equal, round, and reactive to light.  Cardiovascular:     Rate and Rhythm: Normal rate and regular rhythm.     Pulses: Normal pulses.     Heart sounds: Normal heart sounds.  Pulmonary:     Effort: Pulmonary effort is normal.     Breath sounds: Normal breath sounds.  Abdominal:     General: Abdomen is flat. Bowel sounds are normal.     Palpations: Abdomen is soft.  Musculoskeletal:     Left shoulder: Deformity present.       Arms:     Cervical back: Normal range of motion and neck supple.  Skin:    General: Skin is warm.     Capillary Refill: Capillary refill takes less than 2 seconds.  Neurological:     General: No focal deficit present.     Mental Status: She is alert and oriented to person, place, and time.  Psychiatric:        Mood and Affect: Mood normal.        Behavior: Behavior normal.        Thought Content: Thought content normal.        Judgment: Judgment normal.     ED Results / Procedures / Treatments   Labs (all labs ordered are listed, but only abnormal results are displayed) Labs Reviewed  SARS CORONAVIRUS 2 (TAT 6-24 HRS)    EKG None  Radiology DG Ribs Unilateral W/Chest Left  Result Date: 01/26/2021 CLINICAL DATA:  Recent fall with left chest pain, initial encounter EXAM: LEFT RIBS AND CHEST - 3+ VIEW COMPARISON:  None. FINDINGS: Known left humeral fracture is again seen. Cardiac shadow is within normal limits. The lungs are well aerated. No acute rib fracture is noted. IMPRESSION: No rib fracture noted.  Stable left humeral fracture is seen. Electronically Signed   By: Inez Catalina M.D.   On: 01/26/2021 12:59   DG Wrist Complete Left  Result  Date: 01/26/2021 CLINICAL DATA:  Recent fall with known left humeral fracture and wrist pain, initial encounter EXAM: LEFT WRIST - COMPLETE 3+ VIEW COMPARISON:  None. FINDINGS: There is no evidence of fracture or dislocation. There is no evidence of arthropathy  or other focal bone abnormality. Soft tissues are unremarkable. IMPRESSION: No acute abnormality noted. Electronically Signed   By: Inez Catalina M.D.   On: 01/26/2021 13:00   DG Shoulder Left  Result Date: 01/26/2021 CLINICAL DATA:  Recent slip and fall with shoulder pain, initial encounter EXAM: LEFT SHOULDER - 2+ VIEW COMPARISON:  None. FINDINGS: Humeral head is well seated. There is however a mildly impacted fracture in the proximal humeral shaft with angulation at the fracture site. No other bony abnormality is noted. IMPRESSION: Impacted and mildly angulated fracture in the proximal humeral metaphysis. Electronically Signed   By: Inez Catalina M.D.   On: 01/26/2021 12:58    Procedures Procedures   Medications Ordered in ED Medications  morphine 4 MG/ML injection 4 mg (4 mg Intravenous Given 01/26/21 1129)  ondansetron (ZOFRAN) injection 4 mg (4 mg Intravenous Given 01/26/21 1129)  LORazepam (ATIVAN) injection 1 mg (1 mg Intravenous Given 01/26/21 1358)    ED Course  I have reviewed the triage vital signs and the nursing notes.  Pertinent labs & imaging results that were available during my care of the patient were reviewed by me and considered in my medical decision making (see chart for details).    MDM Rules/Calculators/A&P                          Pt d/w Dr. Rich Fuchs PA.  She recommended CT shoulder with 3D recon and a covid swab.  They will see her tomorrow at 0800.  No evidence of wrist fracture.  Ribs on x-ray are not broken.  Pt told that we can't see all rib fx.  She has no pneumos.  She is given an incentive spirometer.  She will be d/c with pain control.   Final Clinical Impression(s) / ED Diagnoses Final diagnoses:   Fall, initial encounter  Closed fracture of proximal end of left humerus, unspecified fracture morphology, initial encounter  Contusion of rib on left side, initial encounter  Fracture    Rx / DC Orders ED Discharge Orders         Ordered    HYDROcodone-acetaminophen (NORCO/VICODIN) 5-325 MG tablet  Every 4 hours PRN        01/26/21 1402    diazepam (VALIUM) 5 MG tablet  2 times daily        01/26/21 1402    ibuprofen (ADVIL) 600 MG tablet  Every 6 hours PRN        01/26/21 1402           Isla Pence, MD 01/26/21 [phone]

## 2021-01-26 NOTE — ED Notes (Signed)
Ortho devices to be placed after CT scan is completed per Adams, PA. Ortho tech made aware

## 2021-01-26 NOTE — ED Notes (Signed)
Called ortho tech to apply ortho devices

## 2021-01-26 NOTE — ED Notes (Signed)
Patient was given 2 warm blankets. 

## 2021-01-27 ENCOUNTER — Other Ambulatory Visit: Payer: Self-pay

## 2021-01-27 ENCOUNTER — Encounter (HOSPITAL_BASED_OUTPATIENT_CLINIC_OR_DEPARTMENT_OTHER): Payer: Self-pay | Admitting: Orthopaedic Surgery

## 2021-01-27 LAB — SARS CORONAVIRUS 2 (TAT 6-24 HRS): SARS Coronavirus 2: NEGATIVE

## 2021-01-27 MED ORDER — CEFAZOLIN SODIUM-DEXTROSE 2-4 GM/100ML-% IV SOLN
INTRAVENOUS | Status: AC
  Filled 2021-01-27: qty 100

## 2021-01-27 NOTE — Anesthesia Preprocedure Evaluation (Addendum)
Anesthesia Evaluation  Patient identified by MRN, date of birth, ID band Patient awake    Reviewed: Allergy & Precautions, H&P , NPO status , Patient's Chart, lab work & pertinent test results  History of Anesthesia Complications (+) PONV and history of anesthetic complications  Airway Mallampati: I  TM Distance: >3 FB Neck ROM: Full    Dental no notable dental hx. (+) Teeth Intact, Dental Advisory Given   Pulmonary neg pulmonary ROS,    Pulmonary exam normal breath sounds clear to auscultation       Cardiovascular Exercise Tolerance: Good negative cardio ROS Normal cardiovascular exam Rhythm:Regular Rate:Normal     Neuro/Psych negative neurological ROS  negative psych ROS   GI/Hepatic negative GI ROS, Neg liver ROS, GERD  Medicated,  Endo/Other  negative endocrine ROS  Renal/GU negative Renal ROS  negative genitourinary   Musculoskeletal negative musculoskeletal ROS (+) Arthritis , Osteoarthritis,    Abdominal   Peds negative pediatric ROS (+)  Hematology negative hematology ROS (+)   Anesthesia Other Findings   Reproductive/Obstetrics negative OB ROS                            Anesthesia Physical Anesthesia Plan  ASA: II  Anesthesia Plan: General   Post-op Pain Management: GA combined w/ Regional for post-op pain   Induction:   PONV Risk Score and Plan: 3 and Dexamethasone, Treatment may vary due to age or medical condition, Midazolam and Ondansetron  Airway Management Planned: Oral ETT and LMA  Additional Equipment: None  Intra-op Plan:   Post-operative Plan: Extubation in OR  Informed Consent: I have reviewed the patients History and Physical, chart, labs and discussed the procedure including the risks, benefits and alternatives for the proposed anesthesia with the patient or authorized representative who has indicated his/her understanding and acceptance.      Dental advisory given  Plan Discussed with: Anesthesiologist, CRNA and Surgeon  Anesthesia Plan Comments: (Discussed both nerve block for pain relief post-op and GA; including NV, sore throat, dental injury, and pulmonary complications)       Anesthesia Quick Evaluation

## 2021-01-28 ENCOUNTER — Encounter (HOSPITAL_BASED_OUTPATIENT_CLINIC_OR_DEPARTMENT_OTHER)
Admission: RE | Disposition: A | Discharge status: Home / Self Care | Payer: Self-pay | Source: Home / Self Care | Attending: Orthopaedic Surgery

## 2021-01-28 ENCOUNTER — Ambulatory Visit (HOSPITAL_BASED_OUTPATIENT_CLINIC_OR_DEPARTMENT_OTHER): Payer: 59 | Admitting: Anesthesiology

## 2021-01-28 ENCOUNTER — Ambulatory Visit (HOSPITAL_COMMUNITY): Payer: 59

## 2021-01-28 ENCOUNTER — Other Ambulatory Visit: Payer: Self-pay

## 2021-01-28 ENCOUNTER — Ambulatory Visit (HOSPITAL_BASED_OUTPATIENT_CLINIC_OR_DEPARTMENT_OTHER)
Admission: RE | Admit: 2021-01-28 | Discharge: 2021-01-28 | Disposition: A | Discharge status: Home / Self Care | Payer: 59 | Attending: Orthopaedic Surgery | Admitting: Orthopaedic Surgery

## 2021-01-28 ENCOUNTER — Encounter (HOSPITAL_BASED_OUTPATIENT_CLINIC_OR_DEPARTMENT_OTHER): Payer: Self-pay | Admitting: Orthopaedic Surgery

## 2021-01-28 DIAGNOSIS — Z86718 Personal history of other venous thrombosis and embolism: Secondary | ICD-10-CM | POA: Diagnosis not present

## 2021-01-28 DIAGNOSIS — W000XXA Fall on same level due to ice and snow, initial encounter: Secondary | ICD-10-CM | POA: Diagnosis not present

## 2021-01-28 DIAGNOSIS — Z419 Encounter for procedure for purposes other than remedying health state, unspecified: Secondary | ICD-10-CM

## 2021-01-28 DIAGNOSIS — Z79899 Other long term (current) drug therapy: Secondary | ICD-10-CM | POA: Diagnosis not present

## 2021-01-28 DIAGNOSIS — Z09 Encounter for follow-up examination after completed treatment for conditions other than malignant neoplasm: Secondary | ICD-10-CM

## 2021-01-28 DIAGNOSIS — S42202A Unspecified fracture of upper end of left humerus, initial encounter for closed fracture: Secondary | ICD-10-CM | POA: Diagnosis not present

## 2021-01-28 HISTORY — DX: Nausea with vomiting, unspecified: R11.2

## 2021-01-28 HISTORY — PX: ORIF HUMERUS FRACTURE: SHX2126

## 2021-01-28 HISTORY — DX: Other specified postprocedural states: Z98.890

## 2021-01-28 HISTORY — DX: Unspecified fracture of upper end of unspecified humerus, initial encounter for closed fracture: S42.209A

## 2021-01-28 IMAGING — RF DG HUMERUS 2V *L*
1 series · 4 of 4 positions shown · non-contrast
Comparison: [DATE]

CLINICAL DATA: ORIF left humerus.

EXAM:
DG C-ARM 1-60 MIN; LEFT HUMERUS - 2+ VIEW
FLUOROSCOPY TIME:  Fluoroscopy Time:  55 seconds.
Reported radiation dose: 5.65 mGy.

[Series 1: run · 4 of 4 slices shown]
[im 1/4]
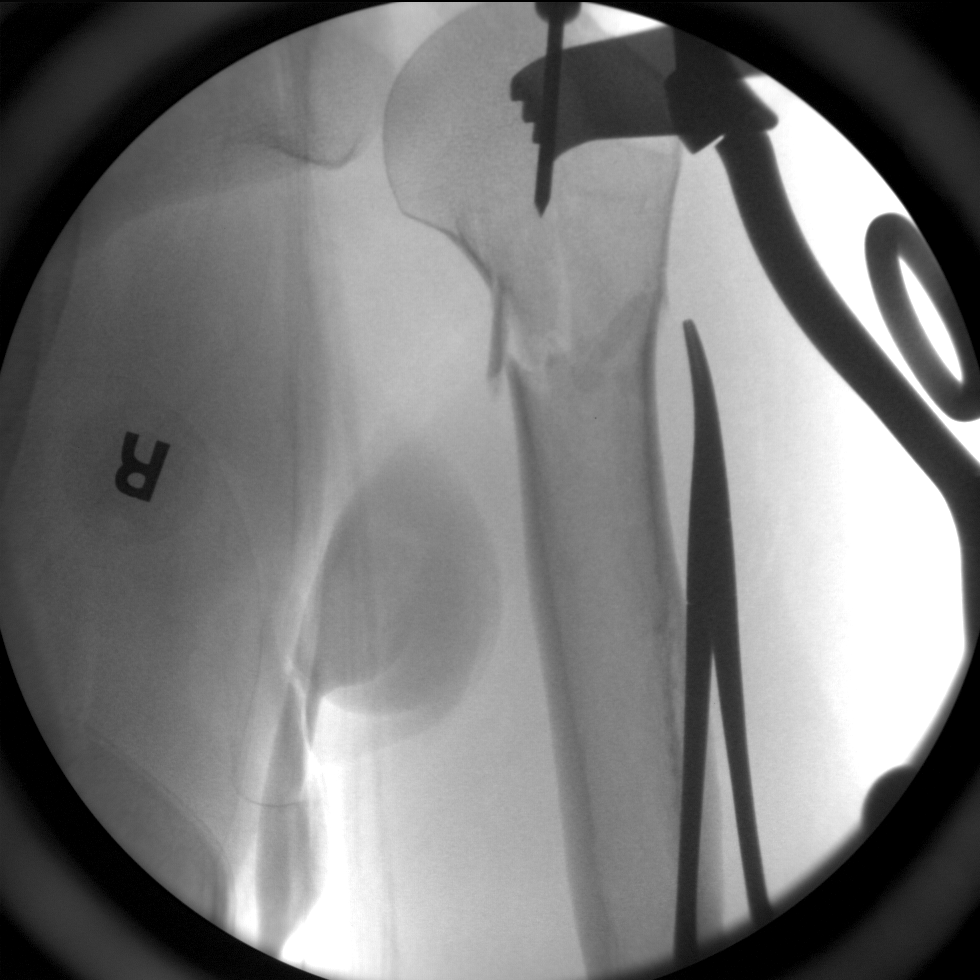
[im 2/4]
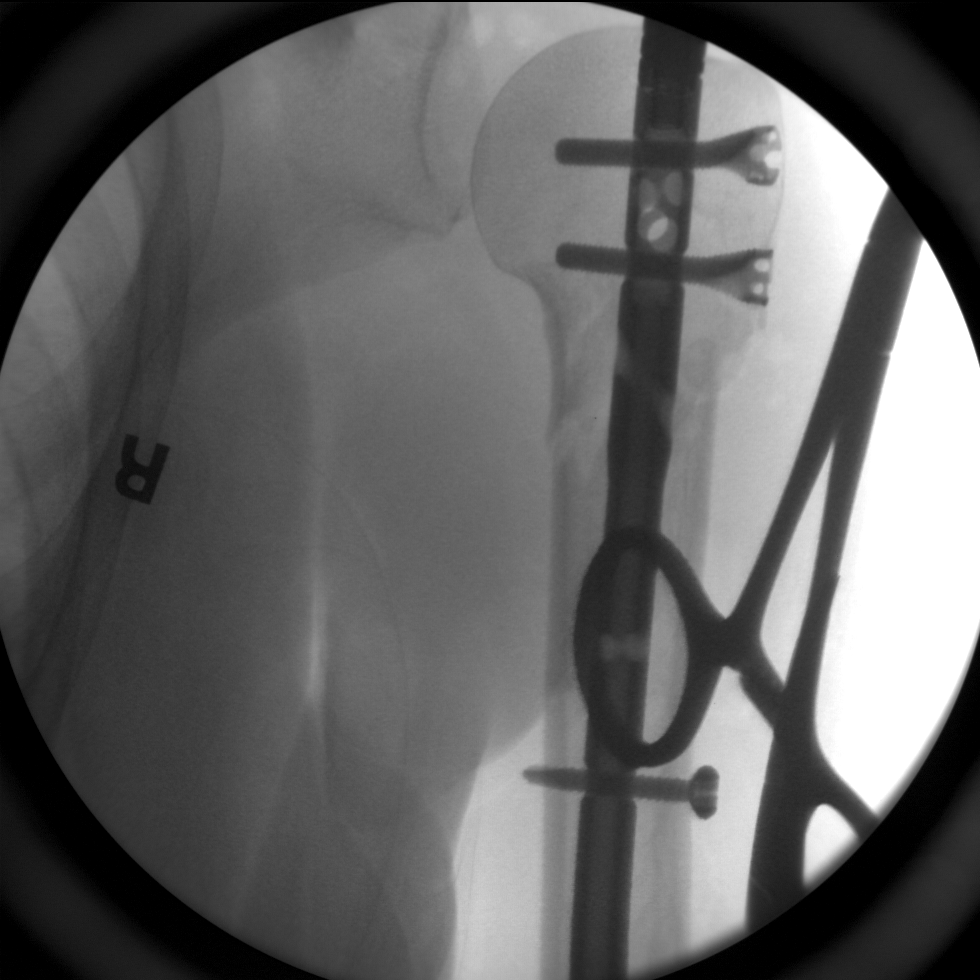
[im 3/4]
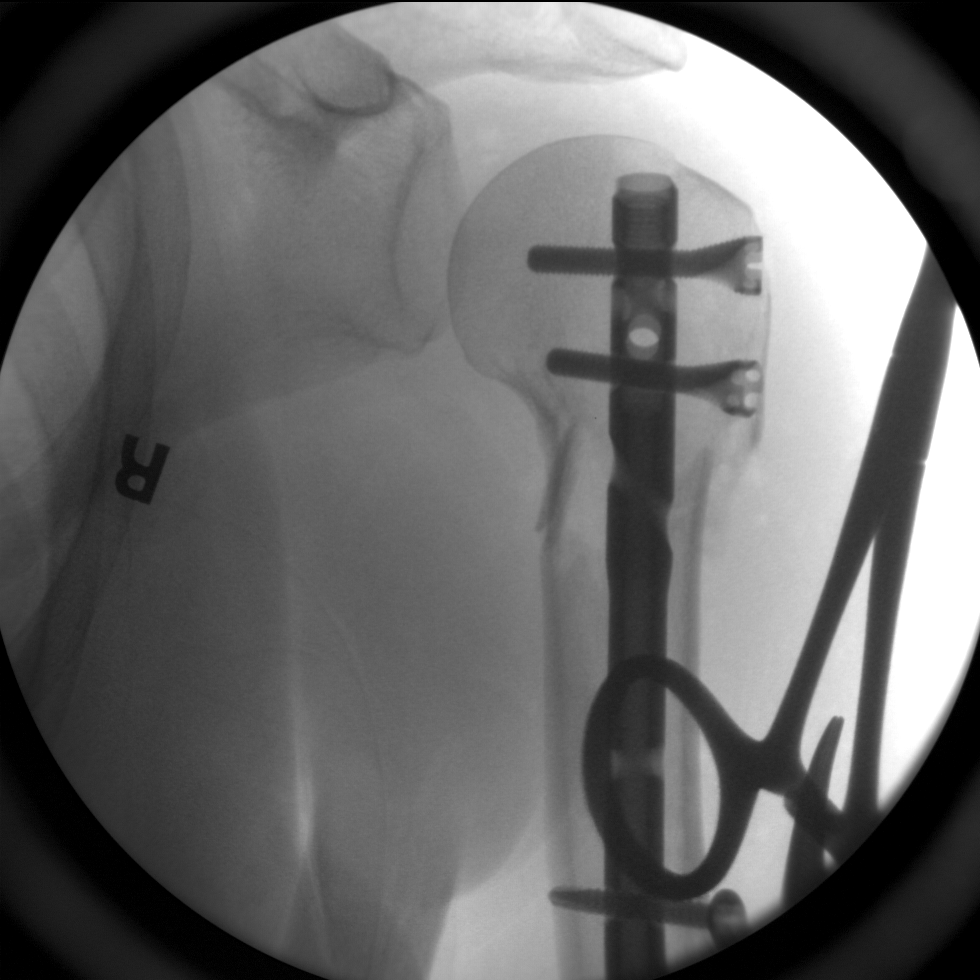
[im 4/4]
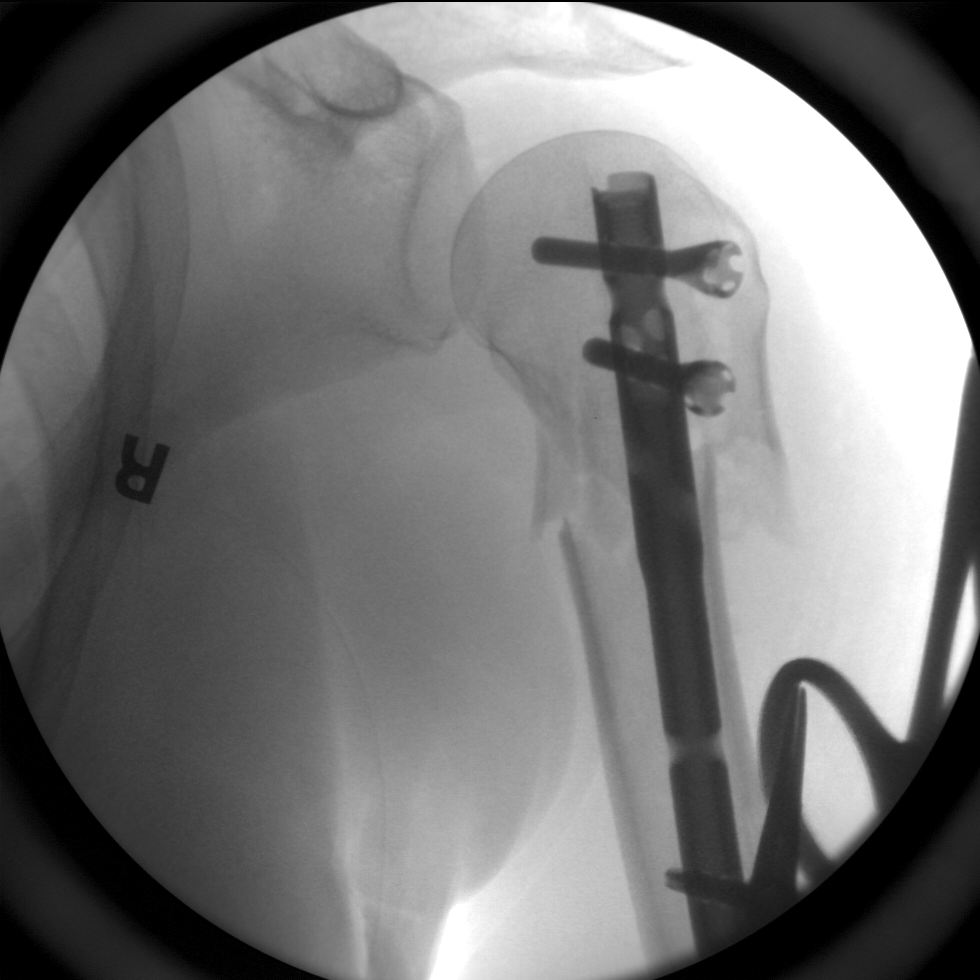

[4 of 4 positions shown; findings below may reference images not displayed]

FINDINGS: Four C-arm fluoroscopic images were obtained intraoperatively and
submitted for post operative interpretation. Partially imaged
intramedullary nail and screw fixation of a proximal humeral
fracture with improved alignment. Please see the performing
provider's procedural report for further detail.
IMPRESSION: Intraoperative fluoroscopic imaging, as described above.

## 2021-01-28 IMAGING — RF DG C-ARM 1-60 MIN
1 series · 4 of 4 positions shown · non-contrast
Comparison: [DATE]

CLINICAL DATA: ORIF left humerus.

EXAM:
DG C-ARM 1-60 MIN; LEFT HUMERUS - 2+ VIEW
FLUOROSCOPY TIME:  Fluoroscopy Time:  55 seconds.
Reported radiation dose: 5.65 mGy.

[Series 1: run · 4 of 4 slices shown]
[im 1/4]
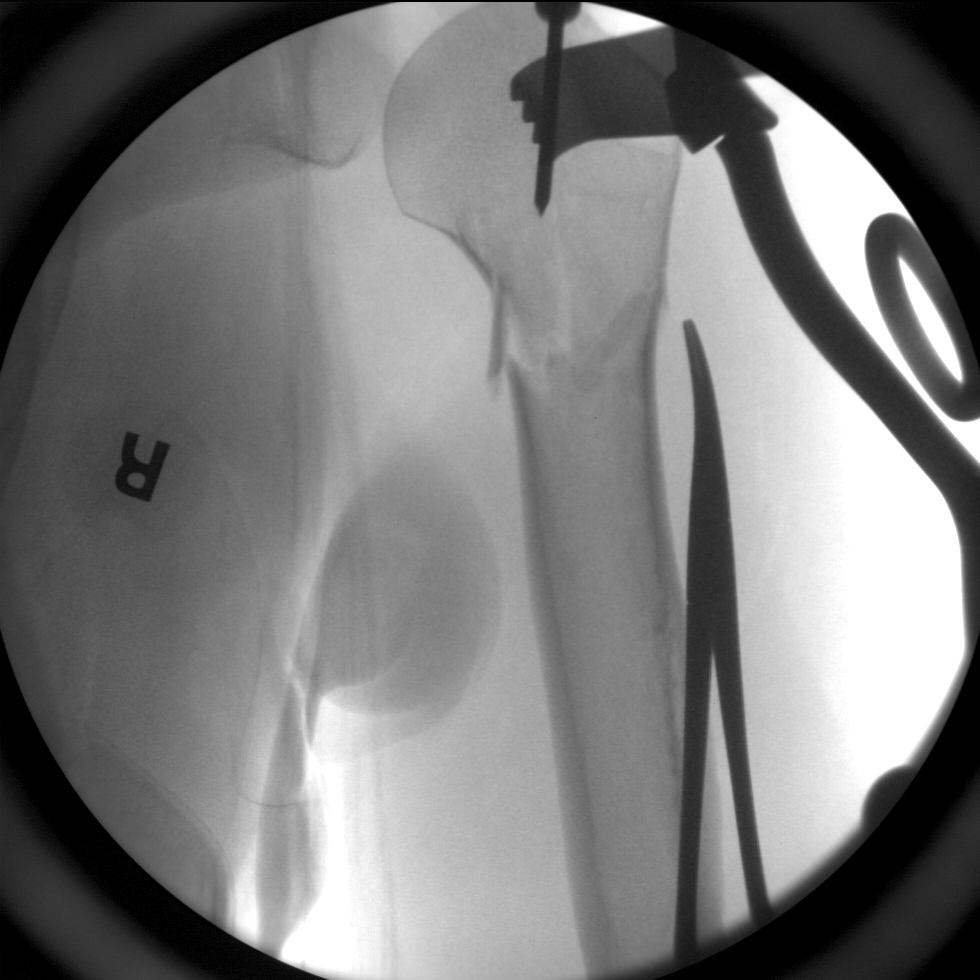
[im 2/4]
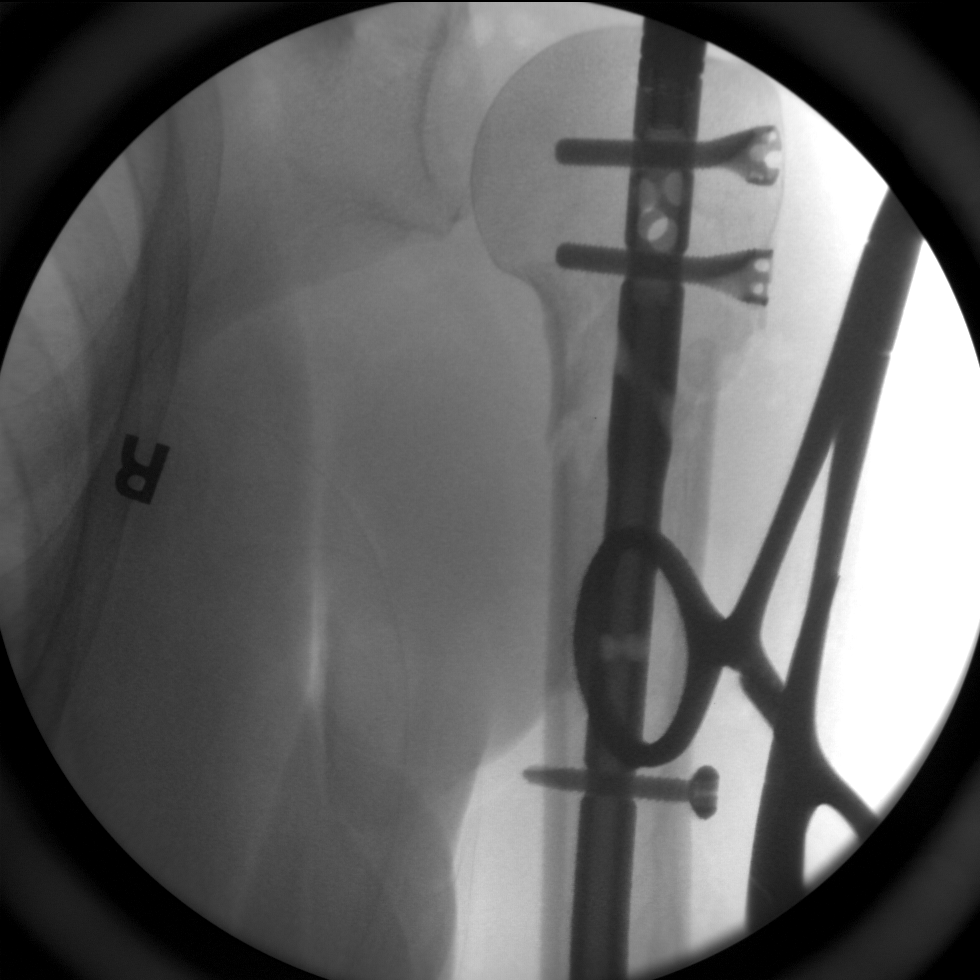
[im 3/4]
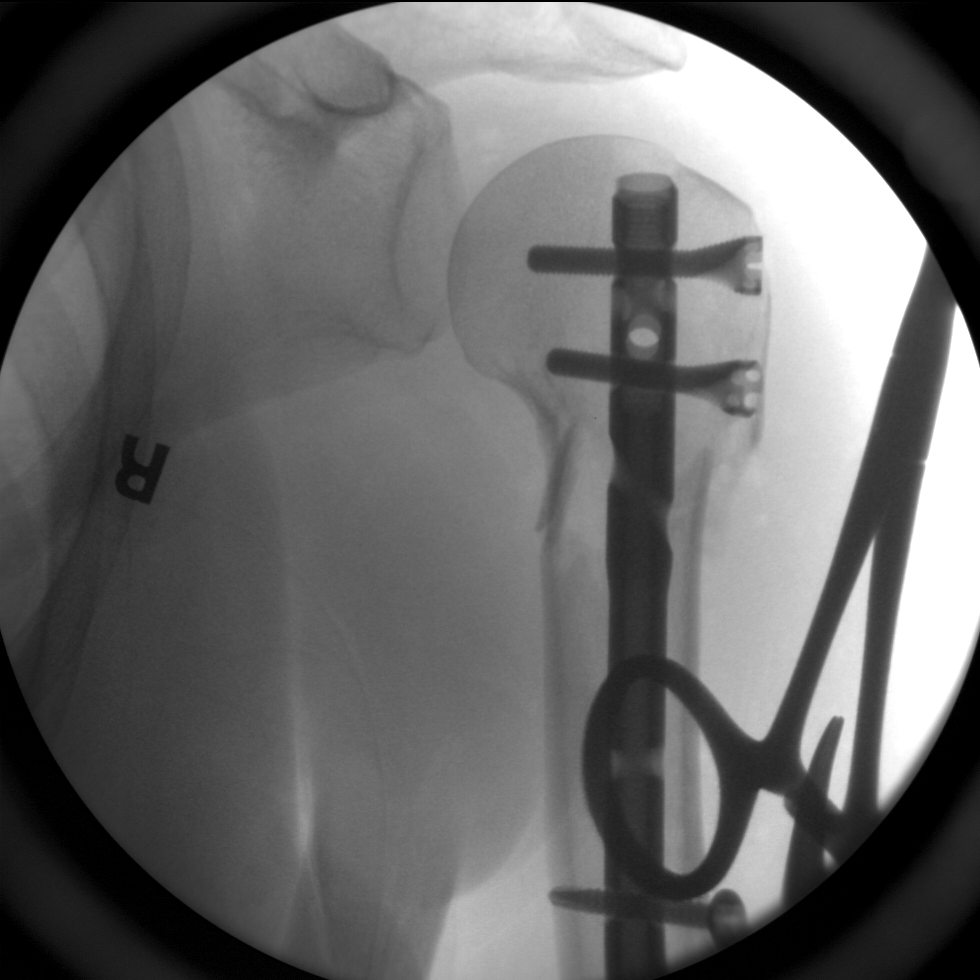
[im 4/4]
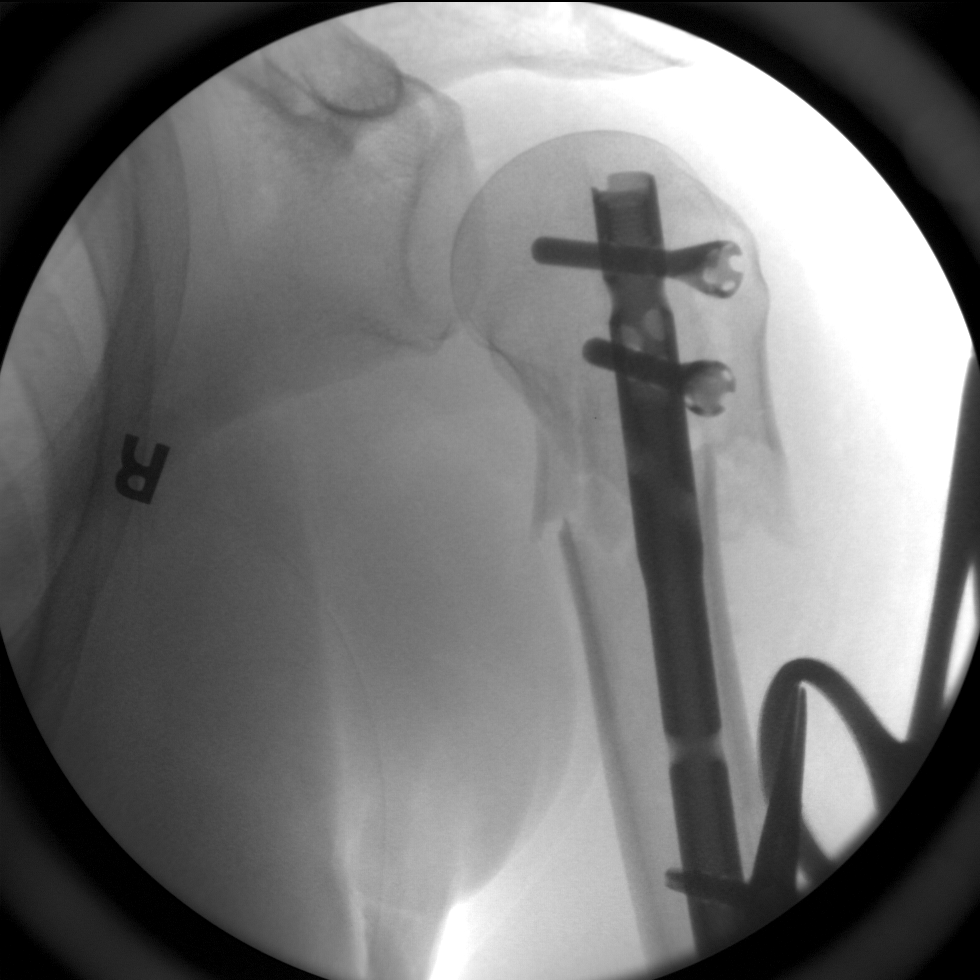

[4 of 4 positions shown; findings below may reference images not displayed]

FINDINGS: Four C-arm fluoroscopic images were obtained intraoperatively and
submitted for post operative interpretation. Partially imaged
intramedullary nail and screw fixation of a proximal humeral
fracture with improved alignment. Please see the performing
provider's procedural report for further detail.
IMPRESSION: Intraoperative fluoroscopic imaging, as described above.

## 2021-01-28 IMAGING — CR DG HUMERUS 2V *L*
2 series · 2 of 2 positions shown · non-contrast
Comparison: [DATE]

CLINICAL DATA: ORIF humeral fracture

EXAM:
LEFT HUMERUS - 2+ VIEW

[humerus lat (1 of 2)]
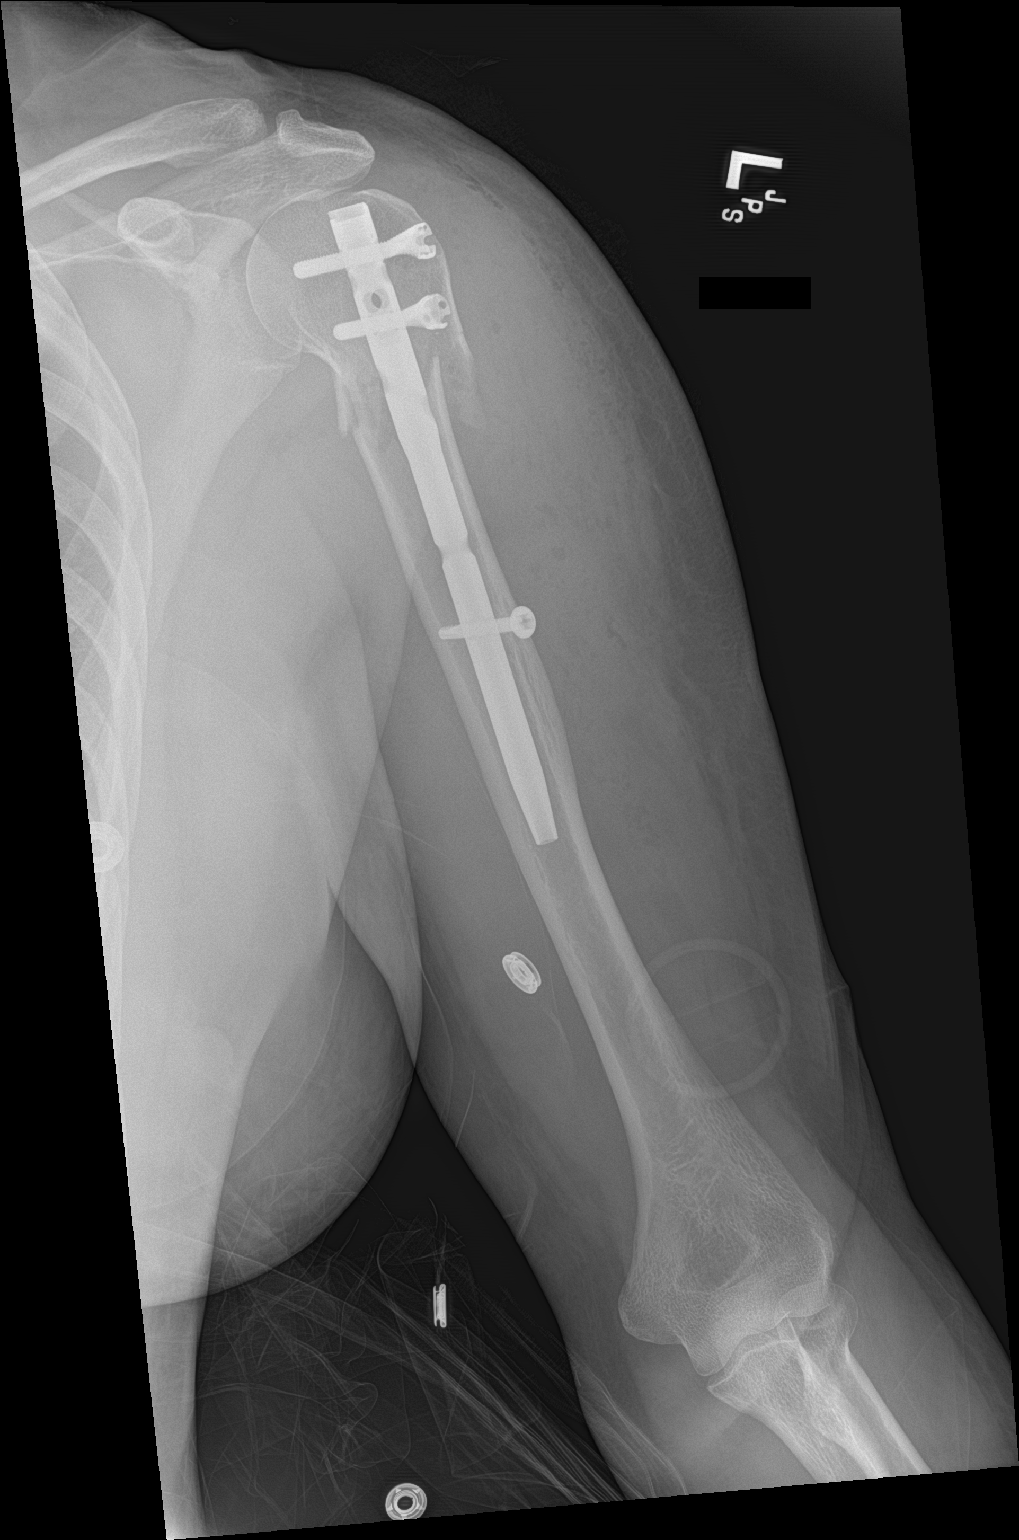

[humerus lat (2 of 2)]
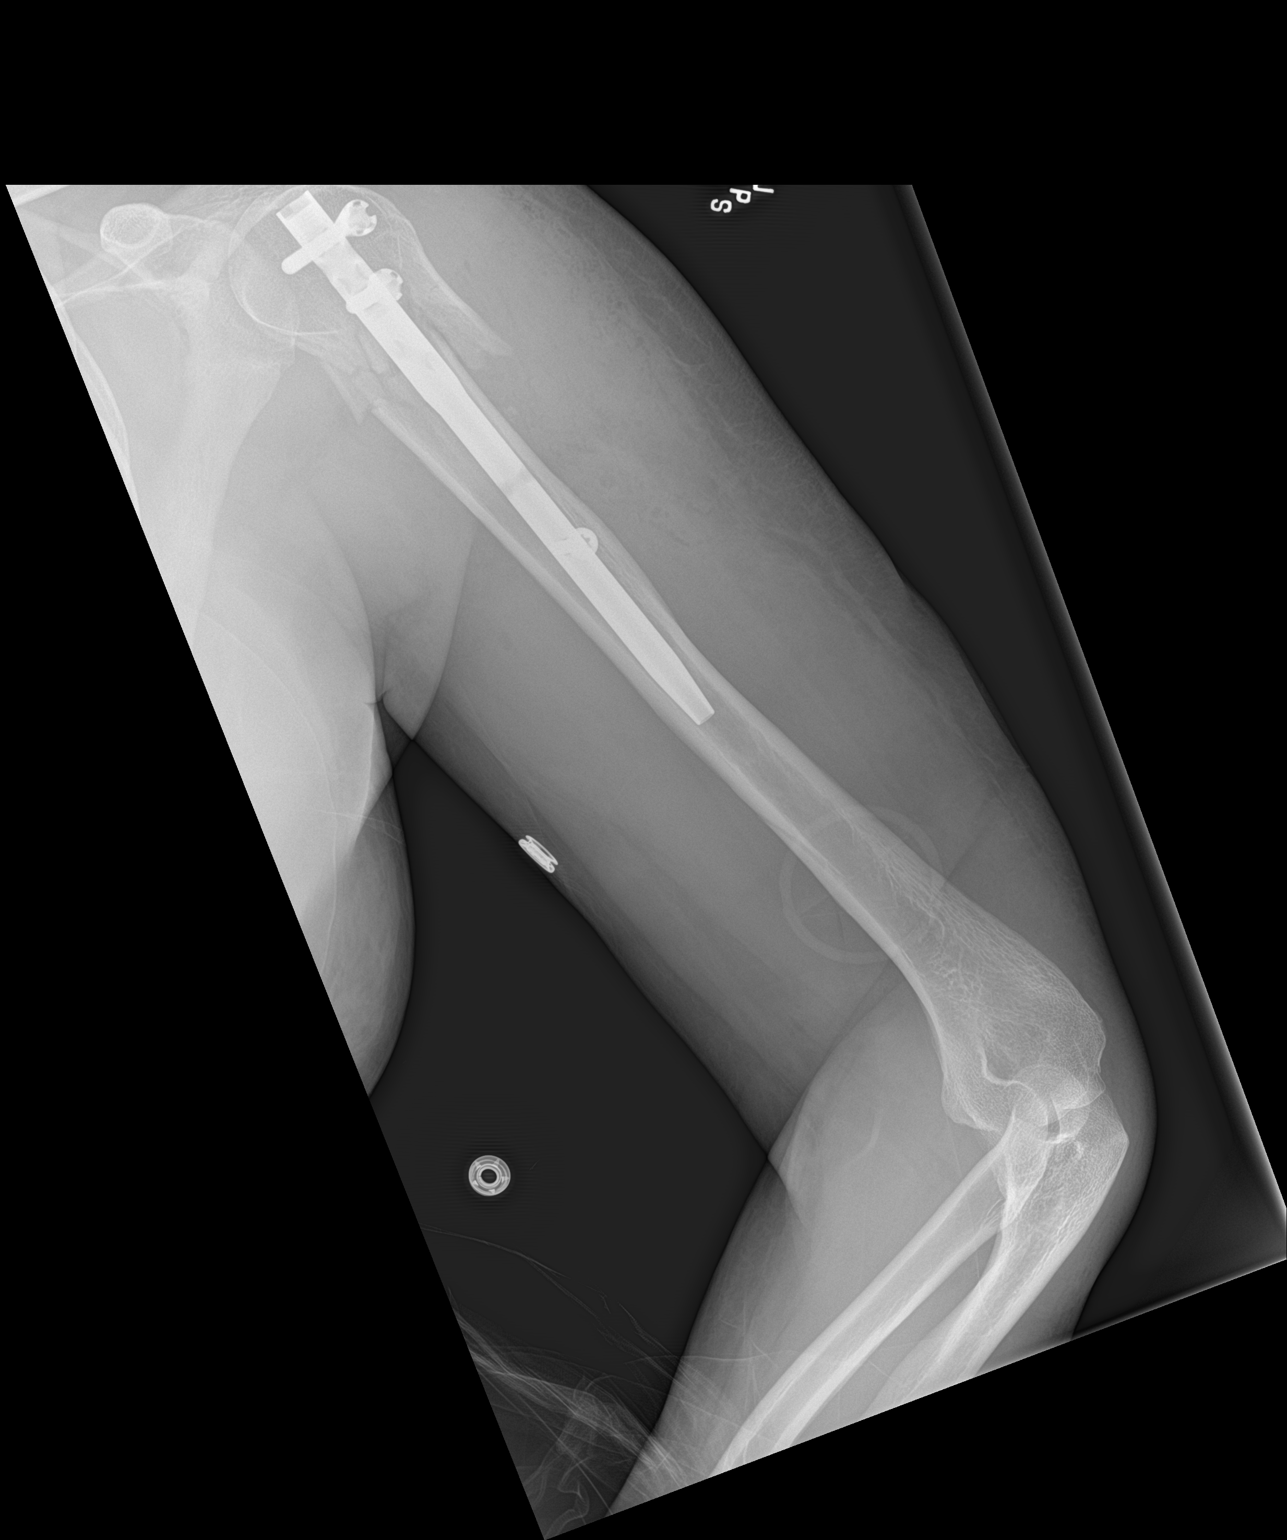

[2 of 2 positions shown; findings below may reference images not displayed]

FINDINGS: Transverse fracture proximal humerus which is comminuted. Locking
intramedullary rod across the fracture in satisfactory position and
alignment.
IMPRESSION: Rod fixation of comminuted fracture proximal left humerus

## 2021-01-28 SURGERY — OPEN REDUCTION INTERNAL FIXATION (ORIF) PROXIMAL HUMERUS FRACTURE
Anesthesia: General | Site: Shoulder | Laterality: Left

## 2021-01-28 MED ORDER — PROPOFOL 10 MG/ML IV BOLUS
INTRAVENOUS | Status: DC | PRN
Start: 2021-01-28 — End: 2021-01-28
  Administered 2021-01-28: 160 mg via INTRAVENOUS

## 2021-01-28 MED ORDER — BUPIVACAINE LIPOSOME 1.3 % IJ SUSP
INTRAMUSCULAR | Status: DC | PRN
  Administered 2021-01-28: 10 mL via PERINEURAL

## 2021-01-28 MED ORDER — FENTANYL CITRATE (PF) 100 MCG/2ML IJ SOLN: 25.0000 ug | INTRAMUSCULAR | Status: DC | PRN

## 2021-01-28 MED ORDER — ACETAMINOPHEN 160 MG/5ML PO SOLN: 325.0000 mg | ORAL | Status: DC | PRN

## 2021-01-28 MED ORDER — CEFAZOLIN SODIUM-DEXTROSE 2-4 GM/100ML-% IV SOLN
2.0000 g | INTRAVENOUS | Status: AC
  Administered 2021-01-28: 2 g via INTRAVENOUS

## 2021-01-28 MED ORDER — OXYCODONE HCL 5 MG PO TABS: ORAL_TABLET | ORAL | 0 refills | Status: AC

## 2021-01-28 MED ORDER — CEFAZOLIN SODIUM-DEXTROSE 2-4 GM/100ML-% IV SOLN
INTRAVENOUS | Status: AC
  Filled 2021-01-28: qty 100

## 2021-01-28 MED ORDER — CELECOXIB 100 MG PO CAPS: 100.0000 mg | ORAL_CAPSULE | Freq: Every day | ORAL | 0 refills | Status: AC

## 2021-01-28 MED ORDER — BUPIVACAINE HCL (PF) 0.5 % IJ SOLN
INTRAMUSCULAR | Status: DC | PRN
  Administered 2021-01-28: 15 mL via PERINEURAL

## 2021-01-28 MED ORDER — ONDANSETRON HCL 4 MG PO TABS: 4.0000 mg | ORAL_TABLET | Freq: Three times a day (TID) | ORAL | 1 refills | Status: AC | PRN

## 2021-01-28 MED ORDER — LACTATED RINGERS IV SOLN: INTRAVENOUS | Status: DC

## 2021-01-28 MED ORDER — FENTANYL CITRATE (PF) 100 MCG/2ML IJ SOLN
INTRAMUSCULAR | Status: AC
  Filled 2021-01-28: qty 2

## 2021-01-28 MED ORDER — RIVAROXABAN 10 MG PO TABS: 10.0000 mg | ORAL_TABLET | Freq: Every day | ORAL | 0 refills | Status: DC

## 2021-01-28 MED ORDER — MIDAZOLAM HCL 2 MG/2ML IJ SOLN
2.0000 mg | Freq: Once | INTRAMUSCULAR | Status: AC
  Administered 2021-01-28: 2 mg via INTRAVENOUS

## 2021-01-28 MED ORDER — LIDOCAINE 2% (20 MG/ML) 5 ML SYRINGE
INTRAMUSCULAR | Status: AC
  Filled 2021-01-28: qty 5

## 2021-01-28 MED ORDER — OXYCODONE HCL 5 MG/5ML PO SOLN: 5.0000 mg | Freq: Once | ORAL | Status: DC | PRN

## 2021-01-28 MED ORDER — METHOCARBAMOL 500 MG PO TABS: 500.0000 mg | ORAL_TABLET | Freq: Three times a day (TID) | ORAL | 0 refills | Status: DC | PRN

## 2021-01-28 MED ORDER — BUPIVACAINE HCL (PF) 0.25 % IJ SOLN
INTRAMUSCULAR | Status: AC
  Filled 2021-01-28: qty 90

## 2021-01-28 MED ORDER — ACETAMINOPHEN 325 MG PO TABS
325.0000 mg | ORAL_TABLET | ORAL | Status: DC | PRN
Start: 2021-01-28 — End: 2021-01-28

## 2021-01-28 MED ORDER — ONDANSETRON HCL 4 MG/2ML IJ SOLN
INTRAMUSCULAR | Status: DC | PRN
  Administered 2021-01-28: 4 mg via INTRAVENOUS

## 2021-01-28 MED ORDER — ONDANSETRON HCL 4 MG/2ML IJ SOLN
INTRAMUSCULAR | Status: AC
  Filled 2021-01-28: qty 2

## 2021-01-28 MED ORDER — LIDOCAINE 2% (20 MG/ML) 5 ML SYRINGE
INTRAMUSCULAR | Status: DC | PRN
  Administered 2021-01-28: 80 mg via INTRAVENOUS

## 2021-01-28 MED ORDER — DEXAMETHASONE SODIUM PHOSPHATE 10 MG/ML IJ SOLN
INTRAMUSCULAR | Status: AC
  Filled 2021-01-28: qty 1

## 2021-01-28 MED ORDER — EPHEDRINE SULFATE 50 MG/ML IJ SOLN
INTRAMUSCULAR | Status: DC | PRN
  Administered 2021-01-28 (×4): 10 mg via INTRAVENOUS

## 2021-01-28 MED ORDER — ONDANSETRON HCL 4 MG/2ML IJ SOLN: 4.0000 mg | Freq: Once | INTRAMUSCULAR | Status: DC | PRN

## 2021-01-28 MED ORDER — FENTANYL CITRATE (PF) 100 MCG/2ML IJ SOLN
100.0000 ug | Freq: Once | INTRAMUSCULAR | Status: AC
  Administered 2021-01-28: 100 ug via INTRAVENOUS

## 2021-01-28 MED ORDER — VANCOMYCIN HCL 1 G IV SOLR
INTRAVENOUS | Status: DC | PRN
  Administered 2021-01-28: 1000 mg via TOPICAL

## 2021-01-28 MED ORDER — DEXAMETHASONE SODIUM PHOSPHATE 4 MG/ML IJ SOLN
INTRAMUSCULAR | Status: DC | PRN
  Administered 2021-01-28: 10 mg via INTRAVENOUS

## 2021-01-28 MED ORDER — ACETAMINOPHEN 500 MG PO TABS: 1000.0000 mg | ORAL_TABLET | Freq: Three times a day (TID) | ORAL | 0 refills | Status: AC

## 2021-01-28 MED ORDER — OXYCODONE HCL 5 MG PO TABS: 5.0000 mg | ORAL_TABLET | Freq: Once | ORAL | Status: DC | PRN

## 2021-01-28 MED ORDER — MIDAZOLAM HCL 2 MG/2ML IJ SOLN
INTRAMUSCULAR | Status: AC
  Filled 2021-01-28: qty 2

## 2021-01-28 MED ORDER — PROPOFOL 10 MG/ML IV BOLUS
INTRAVENOUS | Status: AC
  Filled 2021-01-28: qty 20

## 2021-01-28 MED ORDER — MEPERIDINE HCL 25 MG/ML IJ SOLN: 6.2500 mg | INTRAMUSCULAR | Status: DC | PRN

## 2021-01-28 SURGICAL SUPPLY — 64 items
APL PRP STRL LF DISP 70% ISPRP (MISCELLANEOUS) ×1
BIT DRILL 3.8 CALIBRATED STRL (BIT) ×2 IMPLANT
BIT DRILL CAL 3.2 LONG (BIT) ×2 IMPLANT
BLADE CLIPPER SURG (BLADE) IMPLANT
BLADE SURG 10 STRL SS (BLADE) ×2 IMPLANT
BLADE SURG 15 STRL LF DISP TIS (BLADE) ×1 IMPLANT
BLADE SURG 15 STRL SS (BLADE) ×2
CHLORAPREP W/TINT 26 (MISCELLANEOUS) ×2 IMPLANT
CLSR STERI-STRIP ANTIMIC 1/2X4 (GAUZE/BANDAGES/DRESSINGS) ×2 IMPLANT
COOLER ICEMAN CLASSIC (MISCELLANEOUS) ×2 IMPLANT
COVER WAND RF STERILE (DRAPES) IMPLANT
DECANTER SPIKE VIAL GLASS SM (MISCELLANEOUS) IMPLANT
DRAPE C-ARM 42X72 X-RAY (DRAPES) ×2 IMPLANT
DRAPE IMP U-DRAPE 54X76 (DRAPES) ×2 IMPLANT
DRAPE INCISE IOBAN 66X45 STRL (DRAPES) ×2 IMPLANT
DRAPE U-SHAPE 76X120 STRL (DRAPES) ×4 IMPLANT
DRSG AQUACEL AG ADV 3.5X 6 (GAUZE/BANDAGES/DRESSINGS) IMPLANT
DRSG AQUACEL AG ADV 3.5X10 (GAUZE/BANDAGES/DRESSINGS) IMPLANT
DRSG PAD ABDOMINAL 8X10 ST (GAUZE/BANDAGES/DRESSINGS) ×4 IMPLANT
DRSG TEGADERM 4X4.75 (GAUZE/BANDAGES/DRESSINGS) ×2 IMPLANT
ELECT REM PT RETURN 9FT ADLT (ELECTROSURGICAL) ×2
ELECTRODE REM PT RTRN 9FT ADLT (ELECTROSURGICAL) ×1 IMPLANT
GAUZE SPONGE 4X4 12PLY STRL LF (GAUZE/BANDAGES/DRESSINGS) ×2 IMPLANT
GAUZE XEROFORM 1X8 LF (GAUZE/BANDAGES/DRESSINGS) ×2 IMPLANT
GLOVE ECLIPSE 8.0 STRL XLNG CF (GLOVE) ×2 IMPLANT
GLOVE SRG 8 PF TXTR STRL LF DI (GLOVE) ×1 IMPLANT
GLOVE SURG ENC MOIS LTX SZ6.5 (GLOVE) ×2 IMPLANT
GLOVE SURG LTX SZ6.5 (GLOVE) ×2 IMPLANT
GLOVE SURG UNDER POLY LF SZ6.5 (GLOVE) ×2 IMPLANT
GLOVE SURG UNDER POLY LF SZ8 (GLOVE) ×2
GOWN STRL REUS W/ TWL LRG LVL3 (GOWN DISPOSABLE) ×2 IMPLANT
GOWN STRL REUS W/TWL LRG LVL3 (GOWN DISPOSABLE) ×4
GOWN STRL REUS W/TWL XL LVL3 (GOWN DISPOSABLE) ×2 IMPLANT
KIT LEG STABILIZATION (KITS) ×2 IMPLANT
KIT STABILIZATION SHOULDER (MISCELLANEOUS) IMPLANT
NAIL CANN 8MM 160 LEFT (Plate) ×2 IMPLANT
NS IRRIG 1000ML POUR BTL (IV SOLUTION) ×4 IMPLANT
PACK ARTHROSCOPY DSU (CUSTOM PROCEDURE TRAY) ×2 IMPLANT
PACK BASIN DAY SURGERY FS (CUSTOM PROCEDURE TRAY) ×2 IMPLANT
PAD COLD SHLDR WRAP-ON (PAD) ×2 IMPLANT
PAD ORTHO SHOULDER 7X19 LRG (SOFTGOODS) ×2 IMPLANT
PENCIL SMOKE EVACUATOR (MISCELLANEOUS) ×2 IMPLANT
ROD GUIDE W/STOP 2.5 SRTL (ROD) ×2 IMPLANT
ROD REAMING 2.5 (INSTRUMENTS) ×2 IMPLANT
SCREW LOCK 4.5X38 TI FT (Screw) ×2 IMPLANT
SCREW LOCK 4.5X40 (Screw) ×2 IMPLANT
SCREW LOCK FT 40X4.5X3.9XSLF (Screw) ×1 IMPLANT
SCREW LOCKING 4.0 28MM (Screw) ×2 IMPLANT
SHEET MEDIUM DRAPE 40X70 STRL (DRAPES) ×2 IMPLANT
SLEEVE SCD COMPRESS KNEE MED (MISCELLANEOUS) ×2 IMPLANT
SLING ARM FOAM STRAP LRG (SOFTGOODS) ×2 IMPLANT
SPONGE LAP 18X18 RF (DISPOSABLE) ×2 IMPLANT
SUT FIBERWIRE #2 38 T-5 BLUE (SUTURE) ×2
SUT MNCRL AB 4-0 PS2 18 (SUTURE) ×2 IMPLANT
SUT VIC AB 0 CT1 27 (SUTURE) ×6
SUT VIC AB 0 CT1 27XBRD ANBCTR (SUTURE) ×3 IMPLANT
SUT VIC AB 2-0 SH 27 (SUTURE)
SUT VIC AB 2-0 SH 27XBRD (SUTURE) IMPLANT
SUT VIC AB 3-0 SH 27 (SUTURE) ×2
SUT VIC AB 3-0 SH 27X BRD (SUTURE) ×1 IMPLANT
SUTURE FIBERWR #2 38 T-5 BLUE (SUTURE) ×1 IMPLANT
SYR BULB IRRIG 60ML STRL (SYRINGE) ×2 IMPLANT
TOWEL GREEN STERILE FF (TOWEL DISPOSABLE) ×4 IMPLANT
TUBE SUCTION HIGH CAP CLEAR NV (SUCTIONS) ×2 IMPLANT

## 2021-01-28 NOTE — Interval H&P Note (Signed)
History and Physical Interval Note:  01/28/2021 8:58 AM  Betty Lucas  has presented today for surgery, with the diagnosis of left proximal humerus fracture.  The various methods of treatment have been discussed with the patient and family. After consideration of risks, benefits and other options for treatment, the patient has consented to  Procedure(s): OPEN REDUCTION INTERNAL FIXATION (ORIF) PROXIMAL HUMERUS FRACTURE (Left) as a surgical intervention.  The patient's history has been reviewed, patient examined, no change in status, stable for surgery.  I have reviewed the patient's chart and labs.  Questions were answered to the patient's satisfaction.     Hiram Gash

## 2021-01-28 NOTE — Anesthesia Procedure Notes (Signed)
Procedure Name: LMA Insertion Date/Time: 01/28/2021 9:26 AM Performed by: Maryella Shivers, CRNA Pre-anesthesia Checklist: Patient identified, Emergency Drugs available, Suction available and Patient being monitored Patient Re-evaluated:Patient Re-evaluated prior to induction Oxygen Delivery Method: Circle system utilized Preoxygenation: Pre-oxygenation with 100% oxygen Induction Type: IV induction Ventilation: Mask ventilation without difficulty LMA: LMA inserted LMA Size: 4.0 Number of attempts: 1 Airway Equipment and Method: Bite block Placement Confirmation: positive ETCO2 Tube secured with: Tape Dental Injury: Teeth and Oropharynx as per pre-operative assessment

## 2021-01-28 NOTE — Anesthesia Postprocedure Evaluation (Signed)
Anesthesia Post Note  Patient: KIRSTI MCALPINE  Procedure(s) Performed: OPEN REDUCTION INTERNAL FIXATION (ORIF) PROXIMAL HUMERUS FRACTURE (Left Shoulder)     Patient location during evaluation: PACU Anesthesia Type: General Level of consciousness: awake and alert Pain management: pain level controlled Vital Signs Assessment: post-procedure vital signs reviewed and stable Respiratory status: spontaneous breathing, nonlabored ventilation, respiratory function stable and patient connected to nasal cannula oxygen Cardiovascular status: blood pressure returned to baseline and stable Postop Assessment: no apparent nausea or vomiting Anesthetic complications: no   No complications documented.  Last Vitals:  Vitals:   01/28/21 1200 01/28/21 1230  BP:  108/61  Pulse:  78  Resp:  18  Temp:  36.6 C  SpO2: 96% 97%    Last Pain:  Vitals:   01/28/21 1230  TempSrc:   PainSc: 0-No pain                 Ethelmae Ringel

## 2021-01-28 NOTE — Anesthesia Procedure Notes (Addendum)
Anesthesia Regional Block: Interscalene brachial plexus block   Pre-Anesthetic Checklist: ,, timeout performed, Correct Patient, Correct Site, Correct Laterality, Correct Procedure, Correct Position, site marked, Risks and benefits discussed,  Surgical consent,  Pre-op evaluation,  At surgeon's request and post-op pain management  Laterality: Left  Prep: chloraprep       Needles:  Injection technique: Single-shot  Needle Type: Echogenic Stimulator Needle     Needle Length: 5cm  Needle Gauge: 22     Additional Needles:   Procedures:, nerve stimulator,,, ultrasound used (permanent image in chart),,,,   Nerve Stimulator or Paresthesia:  Response: hand, 0.45 mA,   Additional Responses:   Narrative:  Start time: 01/28/2021 8:09 AM End time: 01/28/2021 8:15 AM Injection made incrementally with aspirations every 5 mL.  Performed by: Personally  Anesthesiologist: Janeece Riggers, MD  Additional Notes: Functioning IV was confirmed and monitors were applied.  A 74mm 22ga Arrow echogenic stimulator needle was used. Sterile prep and drape,hand hygiene and sterile gloves were used. Ultrasound guidance: relevant anatomy identified, needle position confirmed, local anesthetic spread visualized around nerve(s)., vascular puncture avoided.  Image printed for medical record. Negative aspiration and negative test dose prior to incremental administration of local anesthetic. The patient tolerated the procedure well.

## 2021-01-28 NOTE — Op Note (Signed)
Orthopaedic Surgery Operative Note (CSN: [id number])  Betty Lucas  02-11-1966 Date of Surgery: 01/28/2021   Diagnoses:  Left comminuted 2 part proximal humerus fracture  Procedure: Left humeral nail placement   Operative Finding Successful completion of the planned procedure.  Patient's comminuted fracture indicated to [place] that a nail would be better than the plate to avoid stripping of this area and minimize soft tissue trauma.  We had good fixation with our nail.  Post-operative plan: The patient will be nonweightbearing in a sling with therapy to start early, likely to discontinue her sling at 4 weeks if there is reasonable bone healing.  The patient will be discharged home.  DVT prophylaxis Aspirin 81 mg twice daily for 6 weeks.   Pain control with PRN pain medication preferring oral medicines.  Follow up plan will be scheduled in approximately 7 days for incision check and XR.  Post-Op Diagnosis: Same Surgeons:Primary: Hiram Gash, MD Assistants:Caroline McBane PA-C Location: [facility] OR ROOM 5 Anesthesia: General with regional anesthesia Antibiotics: Ancef 2 g with local vancomycin powder 1 g at the surgical site Tourniquet time: * No tourniquets in log * Estimated Blood Loss: Minimal Complications: None Specimens: None Implants: Implant Name Type Inv. Item Serial No. Manufacturer Lot No. LRB No. Used Action  Left 23mm Multiloc prox Humeral Nail    [employer] [id number] Left 1 Implanted  4.5 x 29mm Multiloc Screw     STERILIZED ON SET Left 1 Implanted  4.54 x 30mm Multi loc Screw     STERILIZED ON SET Left 1 Implanted  SCREW LOCKING 4.0 28MM - [id number] Screw SCREW LOCKING 4.0 28MM  DEPU[employer] STERILIZED ON SET Left 1 Implanted    Indications for Surgery:   Betty Lucas is a 55 y.o. female with fall resulting in a complex comminuted 2 part proximal humerus fracture with significant deformity.  We did spoke to the patient about her options and in the setting of her  comminuted fracture with displacement we felt that offering surgery was reasonable.  Benefits and risks of operative and nonoperative management were discussed prior to surgery with patient/guardian(s) and informed consent form was completed.  Specific risks including infection, need for additional surgery, nonunion, malunion, neurovascular injury, periprosthetic fracture and cuff issues amongst others.   Procedure:   The patient was identified properly. Informed consent was obtained and the surgical site was marked. The patient was taken up to suite where general anesthesia was induced.  The patient was positioned beachchair on [employer] table.  The left shoulder was prepped and draped in the usual sterile fashion.  Timeout was performed before the beginning of the case.  We began with an anterolateral approach to the humerus.  We came just off the anterolateral border of the acromion and put the shoulder relatively extension.  That point with the skin sharp achieving hemostasis we progressed.  We identified the deltoid and found the raphae within the musculature opening the fascia sharply and then bluntly dissecting the musculature leaving attached to the acromion.  At this point we are able to place stay sutures into the cuff to manipulate the proximal fragment.  We were able to open the rotator cuff in line with its fibers and it was clearly intact.  We were able to open a window into the musculotendinous portion of the rotator cuff and identify the humeral head.  We used orthogonal fluoroscopic images to guide placement of a guidepin in the center center position at the proximalmost aspect  of the humeral head.  We confirmed our position and then open this area with a reamer.  Once this was complete we were able to advance our nail into the proximal fragment while redirecting the distal fragment and indirectly reducing the fracture passing her nail across the fracture site.  We confirmed our placement on  orthogonal views again.  Once we had seated the nail appropriately deep depth buried underneath the surface we began by placing our interference screws.  We used our outrigger to place 2 proximal interlock screws using a knife to go through the skin sharply and bluntly dissecting down through the soft tissues to avoid damage to neurovascular structures.  We placed our cannulas and then drilled and filled proximal screws.  Good good purchase with each screw and no penetration of the humeral head.  We used the same technique to place a distal interlock screw again bluntly dissecting down to ensure that there was no damage to nerves or vessels.  Final fluoroscopic images demonstrated a near anatomic reduction with bridging of the comminuted area.  We irrigated the wound copiously before placing local antibiotic as listed above.  We are able to use FiberWire to perform a side-to-side repair of our split in the musculotendinous portion of the rotator cuff.  We closed the incision in a multilayer fashion with absorbable suture.  Sterile dressing was placed.  Patient was awoken taken to PACU in stable condition.  Noemi Chapel, PA-C, present and scrubbed throughout the case, critical for completion in a timely fashion, and for retraction, instrumentation, closure.

## 2021-01-28 NOTE — Transfer of Care (Signed)
Immediate Anesthesia Transfer of Care Note  Patient: KAWEHI HOSTETTER  Procedure(s) Performed: OPEN REDUCTION INTERNAL FIXATION (ORIF) PROXIMAL HUMERUS FRACTURE (Left Shoulder)  Patient Location: PACU  Anesthesia Type:GA combined with regional for post-op pain  Level of Consciousness: sedated  Airway & Oxygen Therapy: Patient Spontanous Breathing and Patient connected to face mask oxygen  Post-op Assessment: Report given to RN and Post -op Vital signs reviewed and stable  Post vital signs: Reviewed and stable  Last Vitals:  Vitals Value Taken Time  BP 112/66 01/28/21 1102  Temp    Pulse 70 01/28/21 1104  Resp 11 01/28/21 1104  SpO2 97 % 01/28/21 1104  Vitals shown include unvalidated device data.  Last Pain:  Vitals:   01/28/21 0730  TempSrc: Oral  PainSc: 5       Patients Stated Pain Goal: 4 (55/73/22 0254)  Complications: No complications documented.

## 2021-01-28 NOTE — Discharge Instructions (Signed)
Ophelia Charter MD, MPH Noemi Chapel, PA-C [place] [address], [address] [phone] (fax)   [place] . You may remove the Operative Dressing on Post-Op Day #3 (72hrs after surgery).   . Alternatively if you would like you can leave dressing on until follow-up if within 7-8 days but keep it dry. . Leave steri-strips in place until they fall off on their own, usually 2 weeks postop. . There may be a small amount of fluid/bleeding leaking at the surgical site.  o This is normal; the shoulder is filled with fluid during the procedure and can leak for 24-48hrs after surgery.  . You may change/reinforce the bandage as needed.  . Use the Cryocuff or Ice as often as possible for the first 7 days, then as needed for pain relief. Always keep a towel, ACE wrap or other barrier between the cooling unit and your skin.  . You may shower on Post-Op Day #3. Gently pat the area dry. Do not soak the shoulder in water or submerge it. Keep dry incisions as dry as possible. . Do not go swimming in the pool or ocean until 4 weeks after surgery or when otherwise instructed.    EXERCISES/BRACING ? Sling should be used at all times until follow-up.  ? You can remove sling for hygiene.    . Please continue to ambulate and do not stay sitting or lying for too long. Perform foot and wrist pumps to assist in circulation.  POST-OP MEDICATIONS- Multimodal approach to pain control . In general your pain will be controlled with a combination of substances.  Prescriptions unless otherwise discussed are electronically sent to your pharmacy.  This is a carefully made plan we use to minimize narcotic use.     ? Celebrex - Anti-inflammatory medication taken on a scheduled basis ? Acetaminophen - Non-narcotic pain medicine taken on a scheduled basis  ? Oxycodone - This is a strong narcotic, to be used only on an "as needed" basis for  pain. ? Robaxin - this is a muscle relaxer, take as needed for muscle spasms ? Xarelto- This medicine is used to minimize the risk of blood clots after surgery. ? Zofran - take as needed for nausea   FOLLOW-UP . If you develop a Fever (?101.5), Redness or Drainage from the surgical incision site, please call our office to arrange for an evaluation. . Please call the office to schedule a follow-up appointment for your suture removal, 10-14 days post-operatively.    HELPFUL INFORMATION  . If you had a block, it will wear off between 8-24 hrs postop typically.  This is period when your pain may go from nearly zero to the pain you would have had postop without the block.  This is an abrupt transition but nothing dangerous is happening.  You may take an extra dose of narcotic when this happens.  . You may be more comfortable sleeping in a semi-seated position the first few nights following surgery.  Keep a pillow propped under the elbow and forearm for comfort.  If you have a recliner type of chair it might be beneficial.  If not that is fine too, but it would be helpful to sleep propped up with pillows behind your operated shoulder as well under your elbow and forearm.  This will reduce pulling on the suture lines.  . When dressing, put your operative arm in the sleeve first.  When getting  undressed, take your operative arm out last.  Loose fitting, button-down shirts are recommended.  Often in the first days after surgery you may be more comfortable keeping your operative arm under your shirt and not through the sleeve.  . You may return to work/school in the next couple of days when you feel up to it.  Desk work and typing in the sling is fine.  . We suggest you use the pain medication the first night prior to going to bed, in order to ease any pain when the anesthesia wears off. You should avoid taking pain medications on an empty stomach as it will make you nauseous.  . You should wean off  your narcotic medicines as soon as you are able.  Most patients will be off or using minimal narcotics before their first postop appointment.   . Do not drink alcoholic beverages or take illicit drugs when taking pain medications.  . It is against the law to drive while taking narcotics.  In some states it is against the law to drive while your arm is in a sling.   . Pain medication may make you constipated.  Below are a few solutions to try in this order: - Decrease the amount of pain medication if you aren't having pain. - Drink lots of decaffeinated fluids. - Drink prune juice and/or eat dried prunes  . If the first 3 don't work start with additional solutions - Take Colace - an over-the-counter stool softener - Take Senokot - an over-the-counter laxative - Take Miralax - a stronger over-the-counter laxative  For more information including helpful videos and documents visit our website:   [email or web address]    Post Anesthesia Home Care Instructions  Activity: Get plenty of rest for the remainder of the day. A responsible individual must stay with you for 24 hours following the procedure.  For the next 24 hours, DO NOT: -Drive a car -Paediatric nurse -Drink alcoholic beverages -Take any medication unless instructed by your physician -Make any legal decisions or sign important papers.  Meals: Start with liquid foods such as gelatin or soup. Progress to regular foods as tolerated. Avoid greasy, spicy, heavy foods. If nausea and/or vomiting occur, drink only clear liquids until the nausea and/or vomiting subsides. Call your physician if vomiting continues.  Special Instructions/Symptoms: Your throat may feel dry or sore from the anesthesia or the breathing tube placed in your throat during surgery. If this causes discomfort, gargle with warm salt water. The discomfort should disappear within 24 hours.  If you had a scopolamine patch placed  behind your ear for the management of post- operative nausea and/or vomiting:  1. The medication in the patch is effective for 72 hours, after which it should be removed.  Wrap patch in a tissue and discard in the trash. Wash hands thoroughly with soap and water. 2. You may remove the patch earlier than 72 hours if you experience unpleasant side effects which may include dry mouth, dizziness or visual disturbances. 3. Avoid touching the patch. Wash your hands with soap and water after contact with the patch.    Regional Anesthesia Blocks  1. Numbness or the inability to move the "blocked" extremity may last from 3-48 hours after placement. The length of time depends on the medication injected and your individual response to the medication. If the numbness is not going away after 48 hours, call your surgeon.  2. The extremity that is blocked will need to be protected until the numbness is  gone and the  Strength has returned. Because you cannot feel it, you will need to take extra care to avoid injury. Because it may be weak, you may have difficulty moving it or using it. You may not know what position it is in without looking at it while the block is in effect.  3. For blocks in the legs and feet, returning to weight bearing and walking needs to be done carefully. You will need to wait until the numbness is entirely gone and the strength has returned. You should be able to move your leg and foot normally before you try and bear weight or walk. You will need someone to be with you when you first try to ensure you do not fall and possibly risk injury.  4. Bruising and tenderness at the needle site are common side effects and will resolve in a few days.  5. Persistent numbness or new problems with movement should be communicated to the surgeon or the [facility] [phone] [place] [phone]).Information for Discharge Teaching: EXPAREL (bupivacaine liposome  injectable suspension)   Your surgeon or anesthesiologist gave you EXPAREL(bupivacaine) to help control your pain after surgery.   EXPAREL is a local anesthetic that provides pain relief by numbing the tissue around the surgical site.  EXPAREL is designed to release pain medication over time and can control pain for up to 72 hours.  Depending on how you respond to EXPAREL, you may require less pain medication during your recovery.  Possible side effects:  Temporary loss of sensation or ability to move in the area where bupivacaine was injected.  Nausea, vomiting, constipation  Rarely, numbness and tingling in your mouth or lips, lightheadedness, or anxiety may occur.  Call your doctor right away if you think you may be experiencing any of these sensations, or if you have other questions regarding possible side effects.  Follow all other discharge instructions given to you by your surgeon or nurse. Eat a healthy diet and drink plenty of water or other fluids.  If you return to the hospital for any reason within 96 hours following the administration of EXPAREL, it is important for health care providers to know that you have received this anesthetic. A teal colored band has been placed on your arm with the date, time and amount of EXPAREL you have received in order to alert and inform your health care providers. Please leave this armband in place for the full 96 hours following administration, and then you may remove the band.

## 2021-01-28 NOTE — Progress Notes (Signed)
Assisted Dr. Ambrose Pancoast with left, ultrasound guided, interscalene  block. Side rails up, monitors on throughout procedure. See vital signs in flow sheet. Tolerated Procedure well.

## 2021-01-29 ENCOUNTER — Encounter (HOSPITAL_BASED_OUTPATIENT_CLINIC_OR_DEPARTMENT_OTHER): Payer: Self-pay | Admitting: Orthopaedic Surgery

## 2021-02-23 IMAGING — CR DG WRIST COMPLETE 3+V*L*
3 series · 3 of 3 positions shown · non-contrast
Comparison: None.

CLINICAL DATA: Recent fall with known left humeral fracture and
wrist pain, initial encounter

EXAM:
LEFT WRIST - COMPLETE 3+ VIEW

[x wrist pa left]
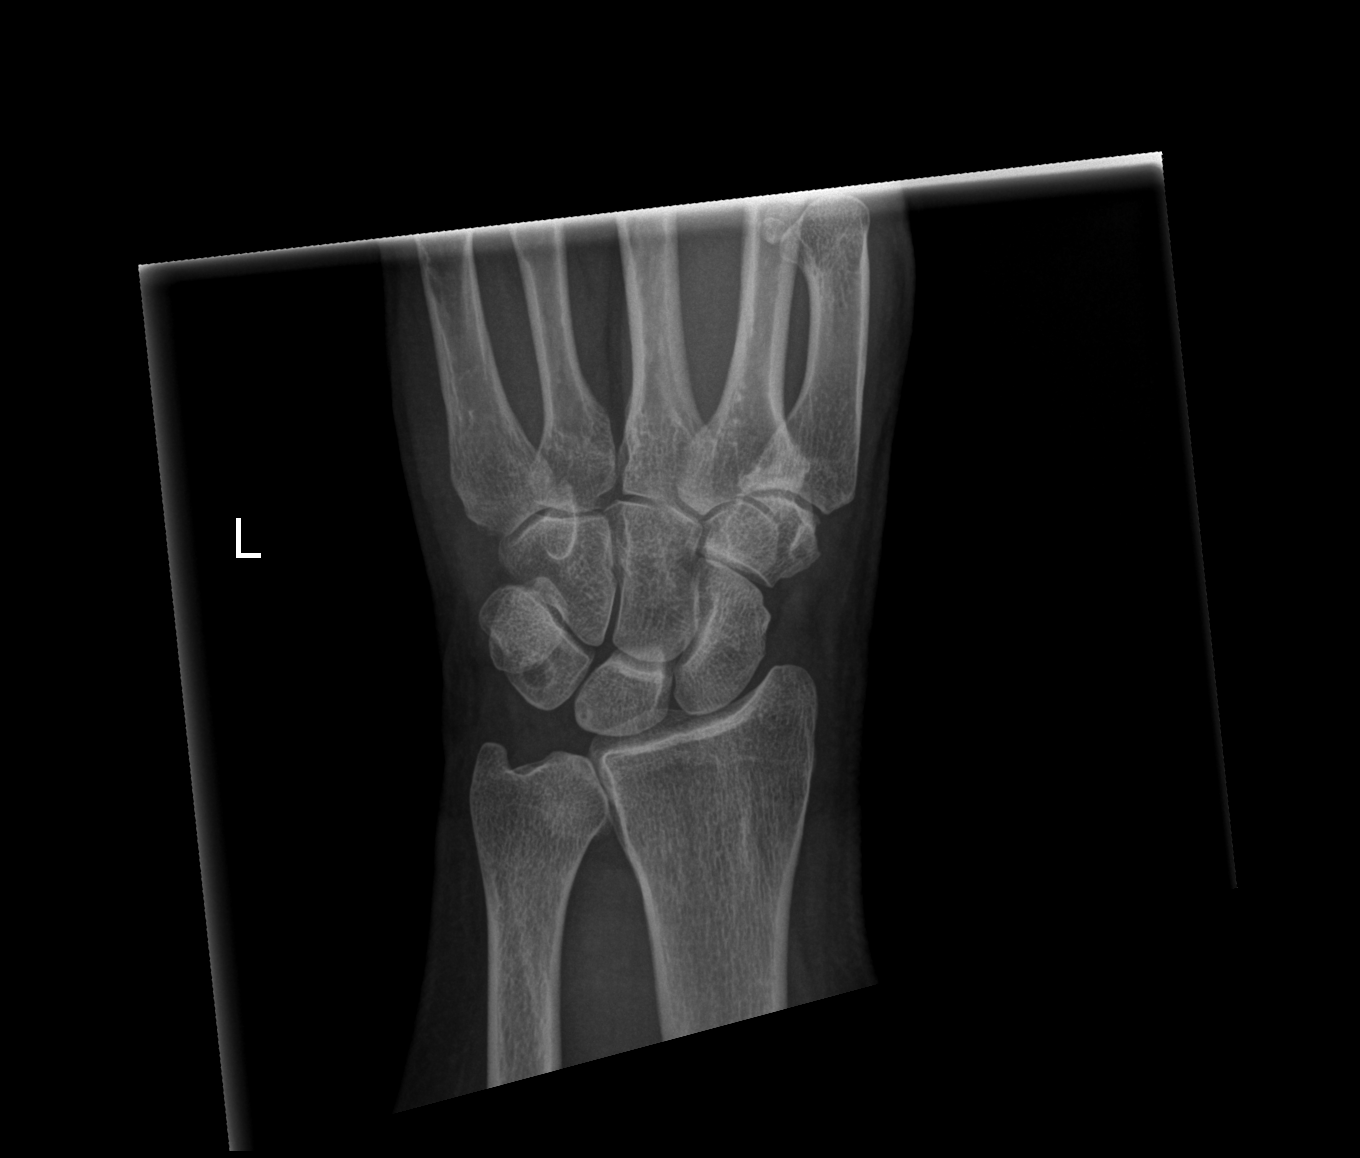

[x wrist lat left (1 of 2)]
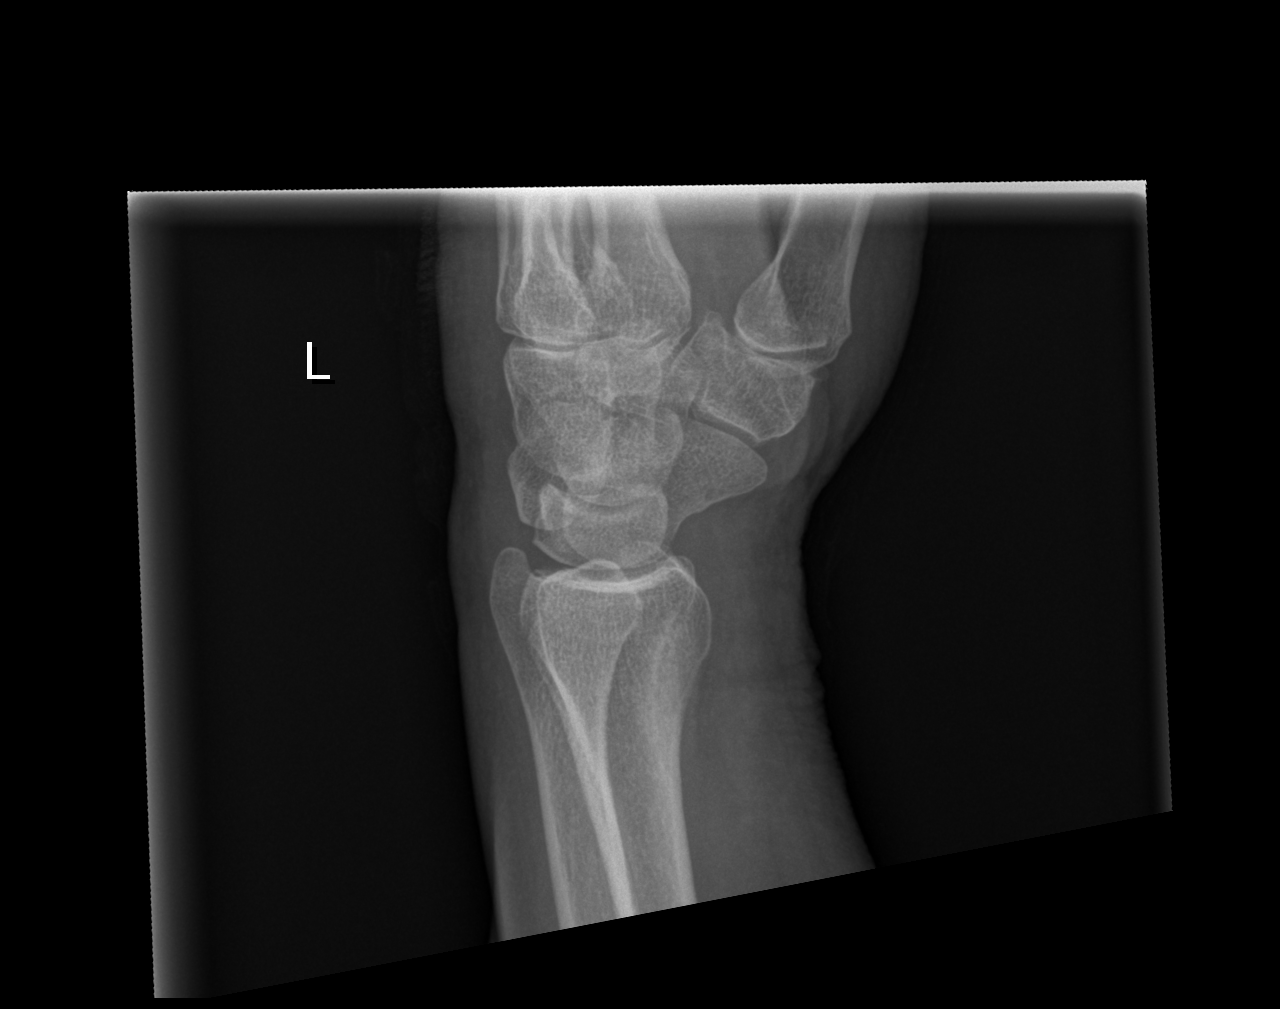

[x wrist lat left (2 of 2)]
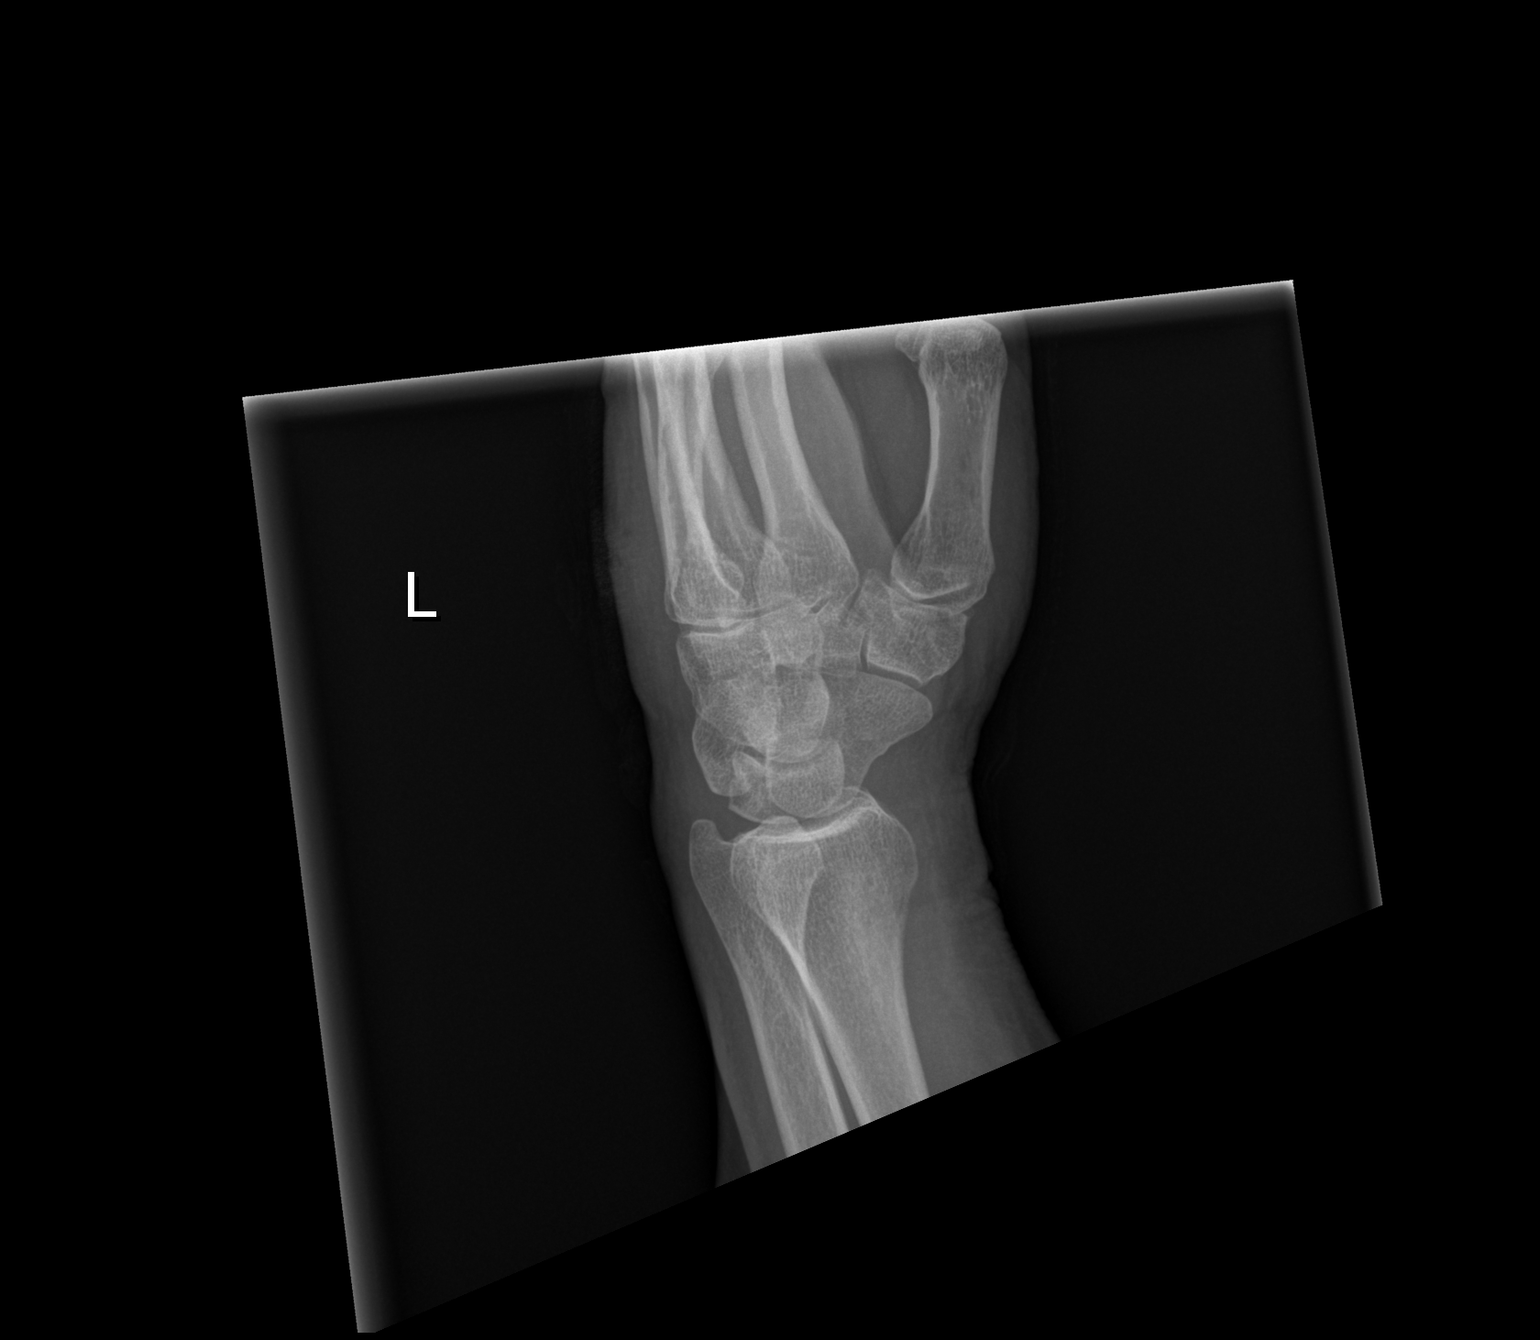

[3 of 3 positions shown; findings below may reference images not displayed]

FINDINGS: There is no evidence of fracture or dislocation. There is no
evidence of arthropathy or other focal bone abnormality. Soft
tissues are unremarkable.
IMPRESSION: No acute abnormality noted.

## 2021-09-14 ENCOUNTER — Telehealth: Payer: Self-pay | Admitting: Medical-Surgical

## 2021-09-14 ENCOUNTER — Other Ambulatory Visit: Payer: Self-pay

## 2021-09-14 DIAGNOSIS — Z0000 Encounter for general adult medical examination without abnormal findings: Secondary | ICD-10-CM

## 2021-09-14 NOTE — Telephone Encounter (Signed)
Labs have been ordered, pt needs to come fasted for labs.

## 2021-09-14 NOTE — Telephone Encounter (Signed)
Thank you :)

## 2021-09-14 NOTE — Telephone Encounter (Signed)
Pt has scheduled her Physical for Nov 1st and wants to come in some time before her appt to have labs drawn.  Please prepare a lab order.  Thanks.

## 2021-10-15 LAB — CBC WITH DIFFERENTIAL/PLATELET
Absolute Monocytes: 437 cells/uL (ref 200–950)
Basophils Absolute: 48 cells/uL (ref 0–200)
Basophils Relative: 1 %
Eosinophils Absolute: 206 cells/uL (ref 15–500)
Eosinophils Relative: 4.3 %
HCT: 39.9 % (ref 35.0–45.0)
Hemoglobin: 13.1 g/dL (ref 11.7–15.5)
Lymphs Abs: 1512 cells/uL (ref 850–3900)
MCH: 29.7 pg (ref 27.0–33.0)
MCHC: 32.8 g/dL (ref 32.0–36.0)
MCV: 90.5 fL (ref 80.0–100.0)
MPV: 9.4 fL (ref 7.5–12.5)
Monocytes Relative: 9.1 %
Neutro Abs: 2597 cells/uL (ref 1500–7800)
Neutrophils Relative %: 54.1 %
Platelets: 311 10*3/uL (ref 140–400)
RBC: 4.41 10*6/uL (ref 3.80–5.10)
RDW: 11.8 % (ref 11.0–15.0)
Total Lymphocyte: 31.5 %
WBC: 4.8 10*3/uL (ref 3.8–10.8)

## 2021-10-15 LAB — COMPLETE METABOLIC PANEL WITH GFR
AG Ratio: 2.2 (calc) (ref 1.0–2.5)
ALT: 13 U/L (ref 6–29)
AST: 16 U/L (ref 10–35)
Albumin: 4.4 g/dL (ref 3.6–5.1)
Alkaline phosphatase (APISO): 51 U/L (ref 37–153)
BUN: 19 mg/dL (ref 7–25)
CO2: 27 mmol/L (ref 20–32)
Calcium: 9.6 mg/dL (ref 8.6–10.4)
Chloride: 106 mmol/L (ref 98–110)
Creat: 0.94 mg/dL (ref 0.50–1.03)
Globulin: 2 g/dL (calc) (ref 1.9–3.7)
Glucose, Bld: 78 mg/dL (ref 65–99)
Potassium: 4.6 mmol/L (ref 3.5–5.3)
Sodium: 143 mmol/L (ref 135–146)
Total Bilirubin: 0.4 mg/dL (ref 0.2–1.2)
Total Protein: 6.4 g/dL (ref 6.1–8.1)
eGFR: 72 mL/min/{1.73_m2} (ref >=60)

## 2021-10-15 LAB — LIPID PANEL
Cholesterol: 219 mg/dL — ABNORMAL HIGH (ref <200)
HDL: 65 mg/dL (ref >=50)
LDL Cholesterol (Calc): 139 mg/dL (calc) — ABNORMAL HIGH
Non-HDL Cholesterol (Calc): 154 mg/dL (calc) — ABNORMAL HIGH (ref <130)
Total CHOL/HDL Ratio: 3.4 (calc) (ref <5.0)
Triglycerides: 60 mg/dL (ref <150)

## 2021-10-16 ENCOUNTER — Encounter: Payer: Self-pay | Admitting: Medical-Surgical

## 2021-10-16 NOTE — Progress Notes (Signed)
Hi Betty Lucas, your cholesterol looks better this year!! Doristine Devoid work!!!!

## 2021-10-20 ENCOUNTER — Encounter: Payer: Self-pay | Admitting: Medical-Surgical

## 2021-10-20 ENCOUNTER — Ambulatory Visit (INDEPENDENT_AMBULATORY_CARE_PROVIDER_SITE_OTHER): Payer: 59 | Admitting: Medical-Surgical

## 2021-10-20 ENCOUNTER — Other Ambulatory Visit: Payer: Self-pay | Admitting: Rehabilitation

## 2021-10-20 ENCOUNTER — Other Ambulatory Visit: Payer: Self-pay

## 2021-10-20 VITALS — BP 109/74 | HR 65 | Resp 20 | Ht 66.0 in | Wt 170.0 lb

## 2021-10-20 DIAGNOSIS — M47812 Spondylosis without myelopathy or radiculopathy, cervical region: Secondary | ICD-10-CM

## 2021-10-20 DIAGNOSIS — Z0000 Encounter for general adult medical examination without abnormal findings: Secondary | ICD-10-CM

## 2021-10-20 DIAGNOSIS — M542 Cervicalgia: Secondary | ICD-10-CM

## 2021-10-20 DIAGNOSIS — Z1231 Encounter for screening mammogram for malignant neoplasm of breast: Secondary | ICD-10-CM | POA: Diagnosis not present

## 2021-10-20 DIAGNOSIS — Z124 Encounter for screening for malignant neoplasm of cervix: Secondary | ICD-10-CM

## 2021-10-20 MED ORDER — PHENTERMINE HCL 37.5 MG PO TABS: 18.7500 mg | ORAL_TABLET | Freq: Every day | ORAL | 0 refills | Status: DC

## 2021-10-20 MED ORDER — VALACYCLOVIR HCL 1 G PO TABS: ORAL_TABLET | ORAL | 2 refills | Status: DC

## 2021-10-20 NOTE — Addendum Note (Signed)
Addended bySamuel Bouche on: 10/20/2021 11:02 AM   Modules accepted: Orders

## 2021-10-20 NOTE — Patient Instructions (Signed)
Preventive Care 40-55 Years Old, Female Preventive care refers to lifestyle choices and visits with your health care provider that can promote health and wellness. This includes: A yearly physical exam. This is also called an annual wellness visit. Regular dental and eye exams. Immunizations. Screening for certain conditions. Healthy lifestyle choices, such as: Eating a healthy diet. Getting regular exercise. Not using drugs or products that contain nicotine and tobacco. Limiting alcohol use. What can I expect for my preventive care visit? Physical exam Your health care provider will check your: Height and weight. These may be used to calculate your BMI (body mass index). BMI is a measurement that tells if you are at a healthy weight. Heart rate and blood pressure. Body temperature. Skin for abnormal spots. Counseling Your health care provider may ask you questions about your: Past medical problems. Family's medical history. Alcohol, tobacco, and drug use. Emotional well-being. Home life and relationship well-being. Sexual activity. Diet, exercise, and sleep habits. Work and work environment. Access to firearms. Method of birth control. Menstrual cycle. Pregnancy history. What immunizations do I need? Vaccines are usually given at various ages, according to a schedule. Your health care provider will recommend vaccines for you based on your age, medical history, and lifestyle or other factors, such as travel or where you work. What tests do I need? Blood tests Lipid and cholesterol levels. These may be checked every 5 years, or more often if you are over 50 years old. Hepatitis C test. Hepatitis B test. Screening Lung cancer screening. You may have this screening every year starting at age 55 if you have a 30-pack-year history of smoking and currently smoke or have quit within the past 15 years. Colorectal cancer screening. All adults should have this screening starting at  age 50 and continuing until age 75. Your health care provider may recommend screening at age 45 if you are at increased risk. You will have tests every 1-10 years, depending on your results and the type of screening test. Diabetes screening. This is done by checking your blood sugar (glucose) after you have not eaten for a while (fasting). You may have this done every 1-3 years. Mammogram. This may be done every 1-2 years. Talk with your health care provider about when you should start having regular mammograms. This may depend on whether you have a family history of breast cancer. BRCA-related cancer screening. This may be done if you have a family history of breast, ovarian, tubal, or peritoneal cancers. Pelvic exam and Pap test. This may be done every 3 years starting at age 21. Starting at age 30, this may be done every 5 years if you have a Pap test in combination with an HPV test. Other tests STD (sexually transmitted disease) testing, if you are at risk. Bone density scan. This is done to screen for osteoporosis. You may have this scan if you are at high risk for osteoporosis. Talk with your health care provider about your test results, treatment options, and if necessary, the need for more tests. Follow these instructions at home: Eating and drinking  Eat a diet that includes fresh fruits and vegetables, whole grains, lean protein, and low-fat dairy products. Take vitamin and mineral supplements as recommended by your health care provider. Do not drink alcohol if: Your health care provider tells you not to drink. You are pregnant, may be pregnant, or are planning to become pregnant. If you drink alcohol: Limit how much you have to 0-1 drink a day. Be   aware of how much alcohol is in your drink. In the [place] one drink equals one 12 oz bottle of beer (355 mL), one 5 oz glass of wine (148 mL), or one 1 oz glass of hard liquor (44 mL). Lifestyle Take daily care of your teeth and  gums. Brush your teeth every morning and night with fluoride toothpaste. Floss one time each day. Stay active. Exercise for at least 30 minutes 5 or more days each week. Do not use any products that contain nicotine or tobacco, such as cigarettes, e-cigarettes, and chewing tobacco. If you need help quitting, ask your health care provider. Do not use drugs. If you are sexually active, practice safe sex. Use a condom or other form of protection to prevent STIs (sexually transmitted infections). If you do not wish to become pregnant, use a form of birth control. If you plan to become pregnant, see your health care provider for a prepregnancy visit. If told by your health care provider, take low-dose aspirin daily starting at age 63. Find healthy ways to cope with stress, such as: Meditation, yoga, or listening to music. Journaling. Talking to a trusted person. Spending time with friends and family. Safety Always wear your seat belt while driving or riding in a vehicle. Do not drive: If you have been drinking alcohol. Do not ride with someone who has been drinking. When you are tired or distracted. While texting. Wear a helmet and other protective equipment during sports activities. If you have firearms in your house, make sure you follow all gun safety procedures. What's next? Visit your health care provider once a year for an annual wellness visit. Ask your health care provider how often you should have your eyes and teeth checked. Stay up to date on all vaccines. This information is not intended to replace advice given to you by your health care provider. Make sure you discuss any questions you have with your health care provider. Document Revised: 02/13/2021 Document Reviewed: 08/17/2018 Elsevier Patient Education  2022 [employer].

## 2021-10-20 NOTE — Progress Notes (Signed)
HPI: Betty Lucas is a 55 y.o. female who  has a past medical history of Actinic keratosis (04/17/2012), History of deep vein thrombosis (DVT) of lower extremity (10/01/2014), IBS (irritable bowel syndrome) (06/06/2017), PONV (postoperative nausea and vomiting), and Proximal humerus fracture.  she presents to [facility] today, 10/20/21,  for chief complaint of: Annual physical exam  Dentist: Eye exam: Exercise: Diet: Pap smear:  Mammogram: Colon cancer screening: COVID vaccine:  Concerns:  Past medical, surgical, social and family history reviewed:  Patient Active Problem List   Diagnosis Date Noted   Cervical spondylosis 04/07/2020   Primary osteoarthritis of left knee 04/07/2020   GERD (gastroesophageal reflux disease) 10/18/2018   IBS (irritable bowel syndrome) 06/06/2017   History of deep vein thrombosis (DVT) of lower extremity 10/01/2014   Family history of malignant neoplasm of other organs or systems 10/29/2013   Family history of melanoma 10/29/2013   Exposure to tanning bed 10/23/2012   Actinic keratosis 04/17/2012   Personal history of other malignant neoplasm of skin 04/17/2012   FINGER PAIN 12/05/2010    Past Surgical History:  Procedure Laterality Date   BREAST ENHANCEMENT SURGERY     FOOT SURGERY     ORIF HUMERUS FRACTURE Left 01/28/2021   Procedure: OPEN REDUCTION INTERNAL FIXATION (ORIF) PROXIMAL HUMERUS FRACTURE;  Surgeon: Hiram Gash, MD;  Location: [facility];  Service: Orthopedics;  Laterality: Left;   OVARIAN CYST SURGERY      Social History   Tobacco Use   Smoking status: Never   Smokeless tobacco: Never  Substance Use Topics   Alcohol use: Yes    Comment: social    Family History  Problem Relation Age of Onset   Hyperlipidemia Mother    Cancer Father    Cancer Paternal Aunt      Current medication list and allergy/intolerance information reviewed:    Current Outpatient  Medications  Medication Sig Dispense Refill   cholecalciferol (VITAMIN D3) 25 MCG (1000 UNIT) tablet Take 1,000 Units by mouth daily.     diazepam (VALIUM) 5 MG tablet Take 1 tablet (5 mg total) by mouth 2 (two) times daily. 10 tablet 0   lubiprostone (AMITIZA) 24 MCG capsule TAKE 1 CAPSULE BY MOUTH 2 (TWO) TIMES DAILY WITH A MEAL. 180 capsule 2   methocarbamol (ROBAXIN) 500 MG tablet Take 1 tablet (500 mg total) by mouth every 8 (eight) hours as needed for muscle spasms. 20 tablet 0   phentermine (ADIPEX-P) 37.5 MG tablet Take 1.75 tablets by mouth daily.     Probiotic Product (PROBIOTIC DAILY PO) Take 1 tablet by mouth daily.     QUERCETIN PO Take 1 tablet by mouth daily.     rivaroxaban (XARELTO) 10 MG TABS tablet Take 1 tablet (10 mg total) by mouth daily. For DVT prophylaxis after surgery 30 tablet 0   valACYclovir (VALTREX) 1000 MG tablet Take 2,000mg  (2 tablets) twice daily for 1 day at start of cold sore symptoms as needed. (Patient not taking: No sig reported) 30 tablet 2   zinc gluconate 50 MG tablet Take 50 mg by mouth daily.     No current facility-administered medications for this visit.    No Known Allergies    Review of Systems: Constitutional:  No  fever, no chills, No recent illness, No unintentional weight changes. No significant fatigue.  HEENT: No  headache, no vision change, no hearing change, No sore throat, No  sinus pressure Cardiac: No  chest  pain, No  pressure, No palpitations, No  Orthopnea Respiratory:  No  shortness of breath. No  Cough Gastrointestinal: No  abdominal pain, No  nausea, No  vomiting,  No  blood in stool, No  diarrhea, No  constipation  Musculoskeletal: No new myalgia/arthralgia Skin: No  Rash, No other wounds/concerning lesions Genitourinary: No  incontinence, No  abnormal genital bleeding, No abnormal genital discharge Hem/Onc: No  easy bruising/bleeding, No  abnormal lymph node Endocrine: No cold intolerance,  No heat intolerance. No  polyuria/polydipsia/polyphagia  Neurologic: No  weakness, No  dizziness, No  slurred speech/focal weakness/facial droop Psychiatric: No  concerns with depression, No  concerns with anxiety, No sleep problems, No mood problems  Exam:  There were no vitals taken for this visit. Constitutional: VS see above. General Appearance: alert, well-developed, well-nourished, NAD Eyes: Normal lids and conjunctive, non-icteric sclera Ears, Nose, Mouth, Throat: MMM, Normal external inspection ears/nares/mouth/lips/gums. TM normal bilaterally. Pharynx/tonsils no erythema, no exudate. Nasal mucosa normal.  Neck: No masses, trachea midline. No thyroid enlargement. No tenderness/mass appreciated. No lymphadenopathy Respiratory: Normal respiratory effort. no wheeze, no rhonchi, no rales Cardiovascular: S1/S2 normal, no murmur, no rub/gallop auscultated. RRR. No lower extremity edema. Pedal pulse II/IV bilaterally DP and PT. No carotid bruit or JVD. No abdominal aortic bruit. Gastrointestinal: Nontender, no masses. No hepatomegaly, no splenomegaly. No hernia appreciated. Bowel sounds normal. Rectal exam deferred.  Musculoskeletal: Gait normal. No clubbing/cyanosis of digits.  Neurological: Normal balance/coordination. No tremor. No cranial nerve deficit on limited exam. Motor and sensation intact and symmetric. Cerebellar reflexes intact.  Skin: warm, dry, intact. No rash/ulcer. No concerning nevi or subq nodules on limited exam.   Psychiatric: Normal judgment/insight. Normal mood and affect. Oriented x3.    ASSESSMENT/PLAN:   There are no diagnoses linked to this encounter.  No orders of the defined types were placed in this encounter.   No orders of the defined types were placed in this encounter.   There are no Patient Instructions on file for this visit.  Follow-up plan: No follow-ups on file.  Clearnce Sorrel, DNP, APRN, FNP-BC [facility]

## 2021-10-25 ENCOUNTER — Ambulatory Visit (INDEPENDENT_AMBULATORY_CARE_PROVIDER_SITE_OTHER): Payer: 59

## 2021-10-25 ENCOUNTER — Other Ambulatory Visit: Payer: Self-pay

## 2021-10-25 DIAGNOSIS — M47812 Spondylosis without myelopathy or radiculopathy, cervical region: Secondary | ICD-10-CM | POA: Diagnosis not present

## 2021-10-25 DIAGNOSIS — M542 Cervicalgia: Secondary | ICD-10-CM | POA: Diagnosis not present

## 2021-10-25 IMAGING — MR MR CERVICAL SPINE W/O CM
5 series · 40 of 48 positions shown · non-contrast
Comparison: MRI cervical spine [DATE].

CLINICAL DATA: Cervicalgia [42] ([42]-CM)

Cervical spondylosis without myelopathy [42] ([42]-CM)
EXAM:
MRI CERVICAL SPINE WITHOUT CONTRAST
TECHNIQUE: Multiplanar, multisequence MR imaging of the cervical spine was
performed. No intravenous contrast was administered.

[Series 2: T2 · sagittal · 3.0mm · 0.69mm/px · 6 of 13 slices shown (1 of 2)]
[im 1/13]
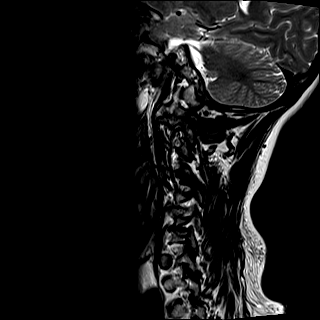
[im 3/13]
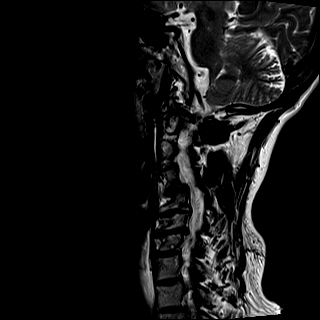
[im 5/13]
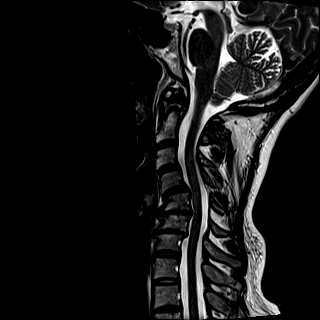
[im 8/13]
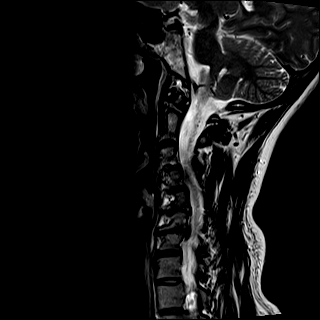
[im 10/13]
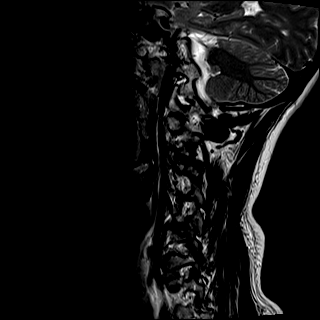
[im 13/13]
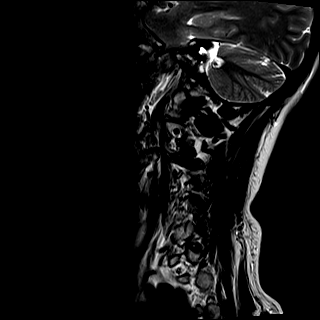

[Series 3: T1 · sagittal · 3.0mm · 0.86mm/px · 7 of 13 slices shown]
[im 1/13]
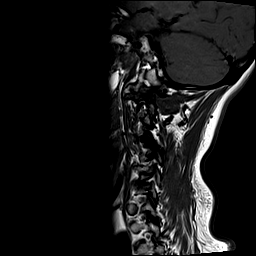
[im 3/13]
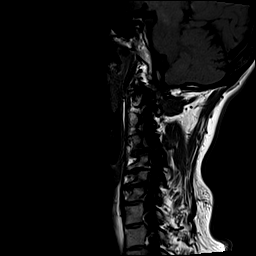
[im 5/13]
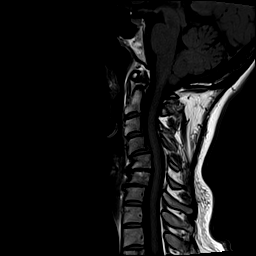
[im 7/13]
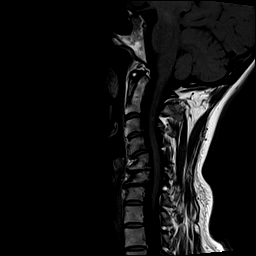
[im 9/13]
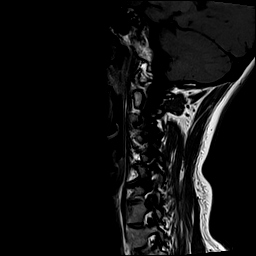
[im 11/13]
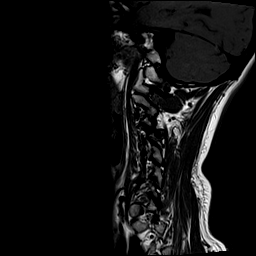
[im 13/13]
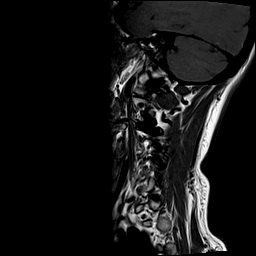

[Series 4: STIR · sagittal · 3.0mm · 0.69mm/px · 7 of 13 slices shown]
[im 1/13]
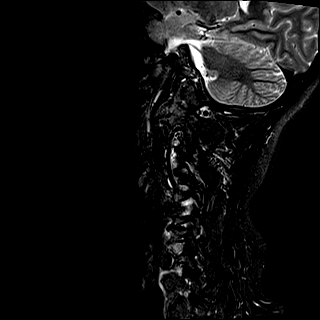
[im 3/13]
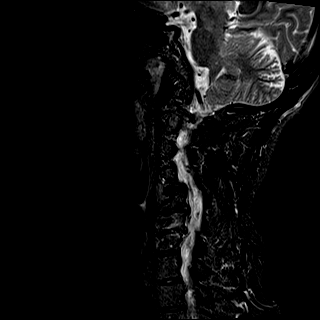
[im 5/13]
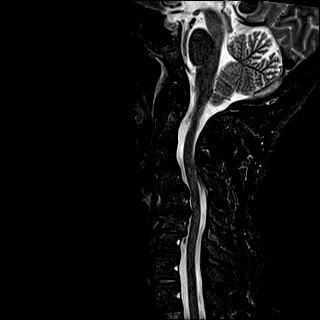
[im 7/13]
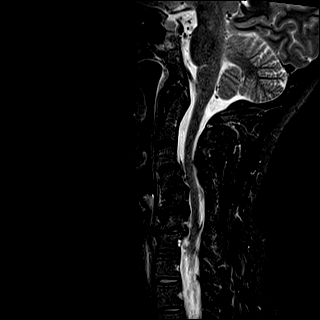
[im 9/13]
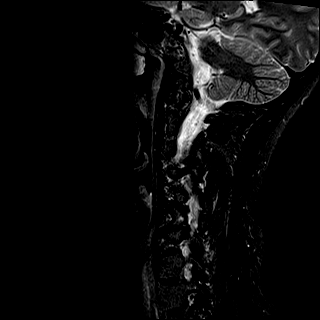
[im 11/13]
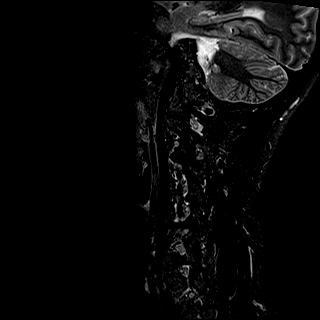
[im 13/13]
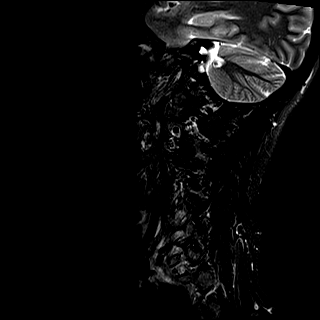

[Series 5: T2 · axial · 3.0mm · 0.62mm/px · z∈[-72,+24]mm · 12 of 27 slices shown (2 of 2)]
[im 1/27]
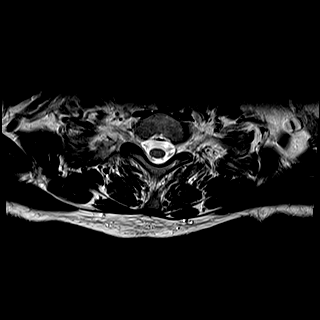
[im 3/27]
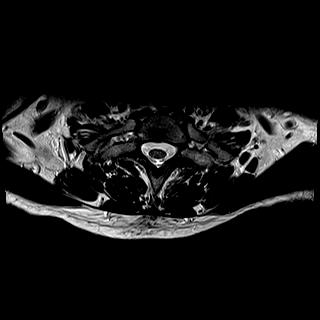
[im 5/27]
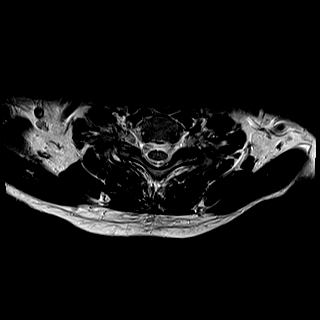
[im 7/27]
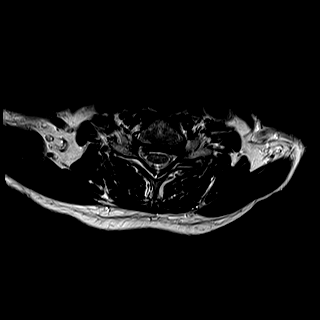
[im 9/27]
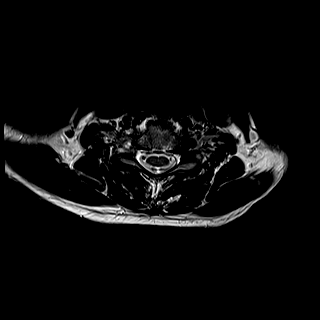
[im 11/27]
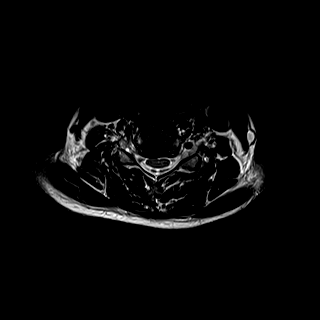
[im 13/27]
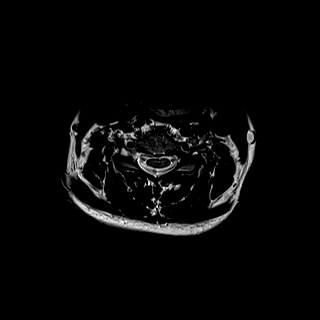
[im 15/27]
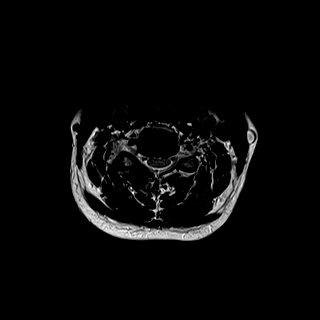
[im 17/27]
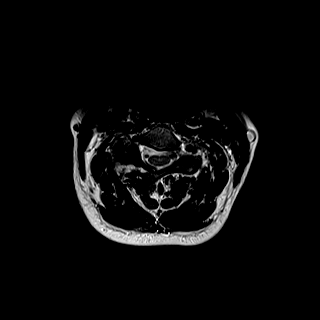
[im 19/27]
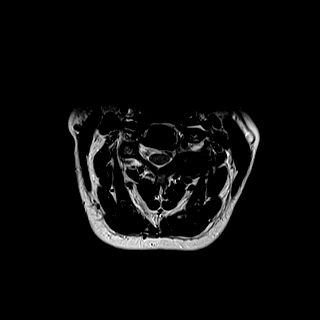
[im 23/27]
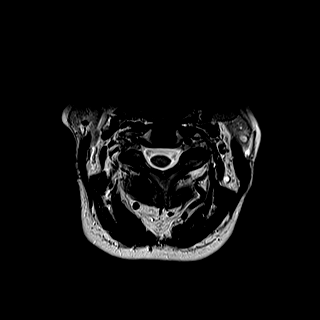
[im 27/27]
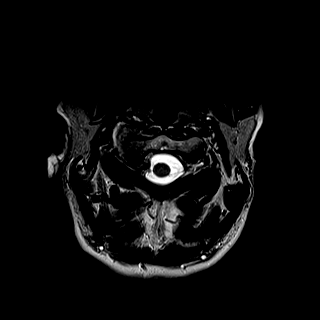

[Series 6: mpgr ax · axial · 3.0mm · 0.35mm/px · z∈[-60,+35]mm · 8 of 27 slices shown]
[im 1/27]
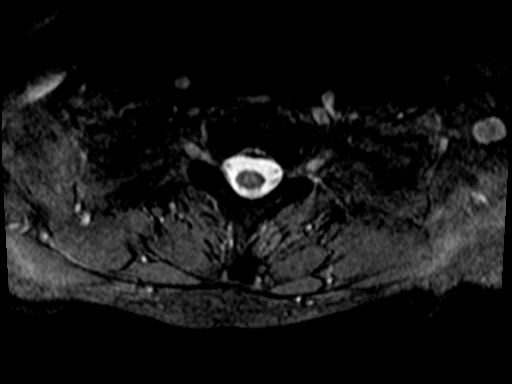
[im 5/27]
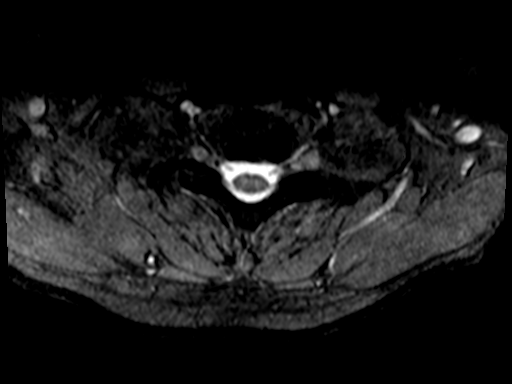
[im 9/27]
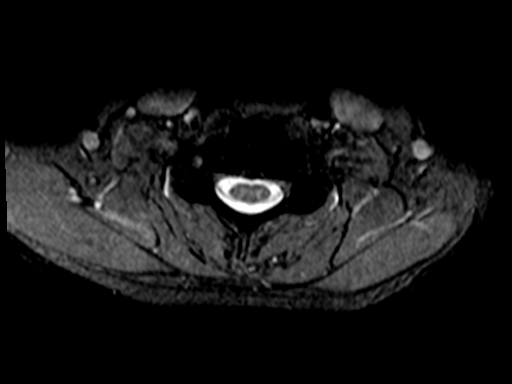
[im 13/27]
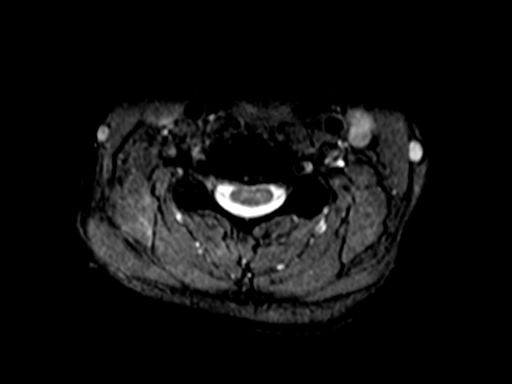
[im 15/27]
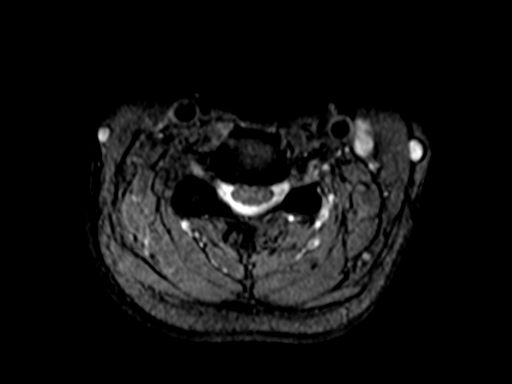
[im 19/27]
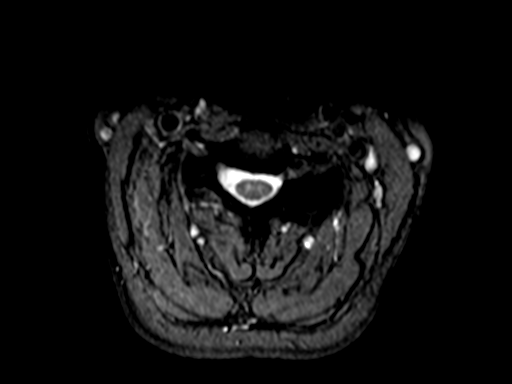
[im 23/27]
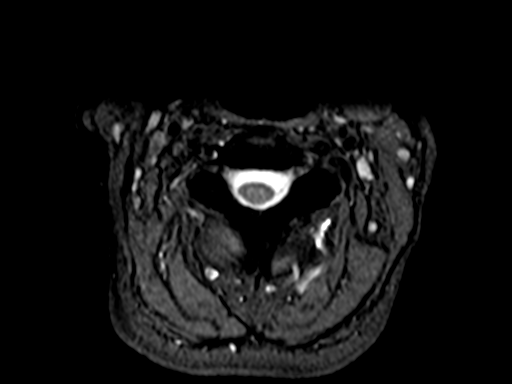
[im 27/27]
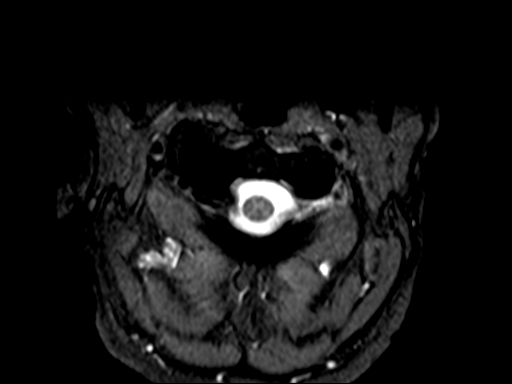

[40 of 48 positions shown; findings below may reference images not displayed]

FINDINGS: Alignment: Similar alignment. Similar reversal of the normal
cervical lordosis with grade 1 anterolisthesis C3 on C4 and C4 on
C5. Similar grade 1 retrolisthesis of C6 on C7.

Vertebrae: Right eccentric degenerative/discogenic endplate signal
changes at C6-C7. Otherwise, no focal marrow edema to suggest acute
fracture or discitis/osteomyelitis.

Cord: Normal cord signal.

Posterior Fossa, vertebral arteries, paraspinal tissues: Visualized
vertebral artery flow voids are maintained. Unremarkable visualized
posterior fossa on limited sagittal assessment.

Disc levels:

C2-C3: No significant disc protrusion, foraminal stenosis, or canal
stenosis.

C3-C4: Severe left facet arthropathy. Similar mild left foraminal
stenosis. No significant canal or right foraminal stenosis.

C4-C5: Moderate right facet arthropathy. Similar mild right
foraminal stenosis. Similar mild spinal canal stenosis. No
significant left foraminal stenosis.

C5-C6: Uncovertebral hypertrophy and endplate spurring mild canal
stenosis without significant foraminal stenosis. No significant
change.

C6-C7: Small posterior disc osteophyte complex. Similar mild canal
stenosis. Bilateral uncovertebral hypertrophy. Severe bilateral
foraminal stenosis, mildly progressed on the right.

C7-T1: No significant disc protrusion, foraminal stenosis, or canal
stenosis.
IMPRESSION: 1. At C6-C7, severe bilateral foraminal stenosis that is progressed
on the right.
2. Otherwise, similar degenerative change including severe left
C3-C4 moderate right C4-C5 facet arthropathy and mild multilevel
canal/foraminal stenosis.

## 2021-10-27 ENCOUNTER — Encounter: Payer: Self-pay | Admitting: Medical-Surgical

## 2021-10-30 ENCOUNTER — Ambulatory Visit (INDEPENDENT_AMBULATORY_CARE_PROVIDER_SITE_OTHER): Payer: 59 | Admitting: Medical-Surgical

## 2021-10-30 ENCOUNTER — Other Ambulatory Visit: Payer: Self-pay

## 2021-10-30 ENCOUNTER — Encounter: Payer: Self-pay | Admitting: Medical-Surgical

## 2021-10-30 VITALS — BP 105/70 | HR 74 | Resp 20 | Ht 66.0 in | Wt 170.0 lb

## 2021-10-30 DIAGNOSIS — R21 Rash and other nonspecific skin eruption: Secondary | ICD-10-CM | POA: Diagnosis not present

## 2021-10-30 MED ORDER — HYDROXYZINE PAMOATE 25 MG PO CAPS: 25.0000 mg | ORAL_CAPSULE | Freq: Three times a day (TID) | ORAL | 0 refills | Status: DC | PRN

## 2021-10-30 MED ORDER — PREDNISONE 10 MG (48) PO TBPK: ORAL_TABLET | Freq: Every day | ORAL | 0 refills | Status: DC

## 2021-10-30 NOTE — Progress Notes (Signed)
  HPI with pertinent ROS:   CC: Rash  HPI: Pleasant 55 year old female presenting today for evaluation of of a rash that has been present for approximately 1-1/2 weeks.  About a month ago, she was seen by dermatology and completed bluelight therapy.  2 weeks after that, she had a trigger point injection at her orthopedist's office.  Several days after the injection, she developed a pruritic, patchy, erythematous rash that started on her lower chest/upper abdomen and has since spread throughout the abdomen, chest, torso, neck, scalp, buttocks, and thighs.  She did not have any larger initial patch to her knowledge.  Has not had any changes in cosmetics, chemicals, foods, or medications.  Has been using Benadryl which does help with sleeping.  Unfortunately, over-the-counter topical corticosteroid creams have not been beneficial.  She also tried using [employer] anti-itch which was not helpful.  Denies fever, chills, shortness of breath, chest pain, and GI symptoms.  Does endorse some episodic epigastric discomfort over the last few days but none today.  Also notes that she feels "puny" and has for the last couple of days.  I reviewed the past medical history, family history, social history, surgical history, and allergies today and no changes were needed.  Please see the problem list section below in epic for further details.   Physical exam:   General: Well Developed, well nourished, and in no acute distress.  Neuro: Alert and oriented x3.  HEENT: Normocephalic, atraumatic.  Skin: Warm and dry. Cardiac: Regular rate and rhythm, no murmurs rubs or gallops, no lower extremity edema.  Respiratory: Clear to auscultation bilaterally. Not using accessory muscles, speaking in full sentences.  Impression and Recommendations:    1. Rash and nonspecific skin eruption Unclear etiology.  Consider new onset allergy, viral exanthem, or medication reaction.  With her overall feeling "puny", this could be  related to a viral illness.  Advised patient to monitor for worsening or new symptoms.  For now, we will treat with a 12-day prednisone taper and hydroxyzine for itching.  Okay to use topical over-the-counter options such as calamine, oatmeal skin care preparations, etc.  May benefit from contacting her dermatologist if no improvement with the prednisone taper.  Return if symptoms worsen or fail to improve. ___________________________________________ Clearnce Sorrel, DNP, APRN, FNP-BC [facility]

## 2021-11-23 ENCOUNTER — Ambulatory Visit: Payer: 59 | Admitting: Medical-Surgical

## 2021-11-25 LAB — HM PAP SMEAR

## 2022-06-10 ENCOUNTER — Encounter: Payer: Self-pay | Admitting: Medical-Surgical

## 2022-08-02 NOTE — Progress Notes (Unsigned)
   Established Patient Office Visit  Subjective   Patient ID: Betty Lucas, female   DOB: 1966-08-25 Age: 56 y.o. MRN: [id number]   No chief complaint on file.   HPI Pleasant 56 year old female presenting today for evaluation of right lower quadrant abdominal pain accompanied by nausea and vomiting.   Objective:    There were no vitals filed for this visit.  Physical Exam  No results found for this or any previous visit (from the past 24 hour(s)).   {Labs (Optional):23779}  The 10-year ASCVD risk score (Arnett DK, et al., 2019) is: 1.7%   Values used to calculate the score:     Age: 78 years     Sex: Female     Is Non-Hispanic African American: No     Diabetic: No     Tobacco smoker: No     Systolic Blood Pressure: 379 mmHg     Is BP treated: No     HDL Cholesterol: 65 mg/dL     Total Cholesterol: 219 mg/dL   Assessment & Plan:   No problem-specific Assessment & Plan notes found for this encounter.   No follow-ups on file.  ___________________________________________ Clearnce Sorrel, DNP, APRN, FNP-BC [facility]

## 2022-08-04 ENCOUNTER — Encounter: Payer: Self-pay | Admitting: Medical-Surgical

## 2022-08-04 ENCOUNTER — Ambulatory Visit (INDEPENDENT_AMBULATORY_CARE_PROVIDER_SITE_OTHER): Payer: 59 | Admitting: Medical-Surgical

## 2022-08-04 VITALS — BP 100/67 | HR 70 | Resp 20 | Ht 66.0 in | Wt 175.0 lb

## 2022-08-04 DIAGNOSIS — R1114 Bilious vomiting: Secondary | ICD-10-CM | POA: Diagnosis not present

## 2022-08-04 DIAGNOSIS — R1011 Right upper quadrant pain: Secondary | ICD-10-CM | POA: Diagnosis not present

## 2022-08-04 DIAGNOSIS — R1031 Right lower quadrant pain: Secondary | ICD-10-CM | POA: Diagnosis not present

## 2022-08-04 DIAGNOSIS — R112 Nausea with vomiting, unspecified: Secondary | ICD-10-CM | POA: Diagnosis not present

## 2022-08-04 NOTE — Patient Instructions (Addendum)
Low-FODMAP Eating Plan  FODMAP stands for fermentable oligosaccharides, disaccharides, monosaccharides, and polyols. These are sugars that are hard for some people to digest. A low-FODMAP eating plan may help some people who have irritable bowel syndrome (IBS) and certain other bowel (intestinal) diseases to manage their symptoms. This meal plan can be complicated to follow. Work with a diet and nutrition specialist (dietitian) to make a low-FODMAP eating plan that is right for you. A dietitian can help make sure that you get enough nutrition from this diet. What are tips for following this plan? Reading food labels Check labels for hidden FODMAPs such as: High-fructose syrup. Honey. Agave. Natural fruit flavors. Onion or garlic powder. Choose low-FODMAP foods that contain 3-4 grams of fiber per serving. Check food labels for serving sizes. Eat only one serving at a time to make sure FODMAP levels stay low. Shopping Shop with a list of foods that are recommended on this diet and make a meal plan. Meal planning Follow a low-FODMAP eating plan for up to 6 weeks, or as told by your health care provider or dietitian. To follow the eating plan: Eliminate high-FODMAP foods from your diet completely. Choose only low-FODMAP foods to eat. You will do this for 2-6 weeks. Gradually reintroduce high-FODMAP foods into your diet one at a time. Most people should wait a few days before introducing the next new high-FODMAP food into their meal plan. Your dietitian can recommend how quickly you may reintroduce foods. Keep a daily record of what and how much you eat and drink. Make note of any symptoms that you have after eating. Review your daily record with a dietitian regularly to identify which foods you can eat and which foods you should avoid. General tips Drink enough fluid each day to keep your urine pale yellow. Avoid processed foods. These often have added sugar and may be high in FODMAPs. Avoid  most dairy products, whole grains, and sweeteners. Work with a dietitian to make sure you get enough fiber in your diet. Avoid high FODMAP foods at meals to manage symptoms. Recommended foods Fruits Bananas, oranges, tangerines, lemons, limes, blueberries, raspberries, strawberries, grapes, cantaloupe, honeydew melon, kiwi, papaya, passion fruit, and pineapple. Limited amounts of dried cranberries, banana chips, and shredded coconut. Vegetables Eggplant, zucchini, cucumber, peppers, green beans, bean sprouts, lettuce, arugula, kale, Swiss chard, spinach, collard greens, bok choy, summer squash, potato, and tomato. Limited amounts of corn, carrot, and sweet potato. Green parts of scallions. Grains Gluten-free grains, such as rice, oats, buckwheat, quinoa, corn, polenta, and millet. Gluten-free pasta, bread, or cereal. Rice noodles. Corn tortillas. Meats and other proteins Unseasoned beef, pork, poultry, or fish. Eggs. Bacon. Tofu (firm) and tempeh. Limited amounts of nuts and seeds, such as almonds, walnuts, brazil nuts, pecans, peanuts, nut butters, pumpkin seeds, chia seeds, and sunflower seeds. Dairy Lactose-free milk, yogurt, and kefir. Lactose-free cottage cheese and ice cream. Non-dairy milks, such as almond, coconut, hemp, and rice milk. Non-dairy yogurt. Limited amounts of goat cheese, brie, mozzarella, parmesan, swiss, and other hard cheeses. Fats and oils Butter-free spreads. Vegetable oils, such as olive, canola, and sunflower oil. Seasoning and other foods Artificial sweeteners with names that do not end in "ol," such as aspartame, saccharine, and stevia. Maple syrup, white table sugar, raw sugar, brown sugar, and molasses. Mayonnaise, soy sauce, and tamari. Fresh basil, coriander, parsley, rosemary, and thyme. Beverages Water and mineral water. Sugar-sweetened soft drinks. Small amounts of orange juice or cranberry juice. Black and green tea. Most dry wines.   Coffee. The items listed  above may not be a complete list of foods and beverages you can eat. Contact a dietitian for more information. Foods to avoid Fruits Fresh, dried, and juiced forms of apple, pear, watermelon, peach, plum, cherries, apricots, blackberries, boysenberries, figs, nectarines, and mango. Avocado. Vegetables Chicory root, artichoke, asparagus, cabbage, snow peas, Brussels sprouts, broccoli, sugar snap peas, mushrooms, celery, and cauliflower. Onions, garlic, leeks, and the white part of scallions. Grains Wheat, including kamut, durum, and semolina. Barley and bulgur. Couscous. Wheat-based cereals. Wheat noodles, bread, crackers, and pastries. Meats and other proteins Fried or fatty meat. Sausage. Cashews and pistachios. Soybeans, baked beans, black beans, chickpeas, kidney beans, fava beans, navy beans, lentils, black-eyed peas, and split peas. Dairy Milk, yogurt, ice cream, and soft cheese. Cream and sour cream. Milk-based sauces. Custard. Buttermilk. Soy milk. Seasoning and other foods Any sugar-free gum or candy. Foods that contain artificial sweeteners such as sorbitol, mannitol, isomalt, or xylitol. Foods that contain honey, high-fructose corn syrup, or agave. Bouillon, vegetable stock, beef stock, and chicken stock. Garlic and onion powder. Condiments made with onion, such as hummus, chutney, pickles, relish, salad dressing, and salsa. Tomato paste. Beverages Chicory-based drinks. Coffee substitutes. Chamomile tea. Fennel tea. Sweet or fortified wines such as port or sherry. Diet soft drinks made with isomalt, mannitol, maltitol, sorbitol, or xylitol. Apple, pear, and mango juice. Juices with high-fructose corn syrup. The items listed above may not be a complete list of foods and beverages you should avoid. Contact a dietitian for more information. Summary FODMAP stands for fermentable oligosaccharides, disaccharides, monosaccharides, and polyols. These are sugars that are hard for some people to  digest. A low-FODMAP eating plan is a short-term diet that helps to ease symptoms of certain bowel diseases. The eating plan usually lasts up to 6 weeks. After that, high-FODMAP foods are reintroduced gradually and one at a time. This can help you find out which foods may be causing symptoms. A low-FODMAP eating plan can be complicated. It is best to work with a dietitian who has experience with this type of plan. This information is not intended to replace advice given to you by your health care provider. Make sure you discuss any questions you have with your health care provider. Document Revised: 04/24/2020 Document Reviewed: 04/24/2020 Elsevier Patient Education  2023 [employer]  

## 2022-08-09 ENCOUNTER — Ambulatory Visit (INDEPENDENT_AMBULATORY_CARE_PROVIDER_SITE_OTHER): Payer: 59

## 2022-08-09 DIAGNOSIS — R1114 Bilious vomiting: Secondary | ICD-10-CM

## 2022-08-09 DIAGNOSIS — R1031 Right lower quadrant pain: Secondary | ICD-10-CM

## 2022-08-09 MED ORDER — IOHEXOL 300 MG/ML  SOLN
100.0000 mL | Freq: Once | INTRAMUSCULAR | Status: AC | PRN
  Administered 2022-08-09: 100 mL via INTRAVENOUS

## 2022-08-10 ENCOUNTER — Other Ambulatory Visit: Payer: 59

## 2022-08-11 ENCOUNTER — Other Ambulatory Visit: Payer: 59

## 2022-09-06 ENCOUNTER — Encounter: Payer: 59 | Admitting: Medical-Surgical

## 2022-09-07 ENCOUNTER — Other Ambulatory Visit: Payer: Self-pay

## 2022-09-07 DIAGNOSIS — Z0000 Encounter for general adult medical examination without abnormal findings: Secondary | ICD-10-CM

## 2022-09-07 NOTE — Addendum Note (Signed)
Addended bySamuel Bouche on: 09/07/2022 08:36 PM   Modules accepted: Orders

## 2022-09-08 LAB — COMPLETE METABOLIC PANEL WITH GFR
AG Ratio: 2.4 (calc) (ref 1.0–2.5)
ALT: 11 U/L (ref 6–29)
AST: 12 U/L (ref 10–35)
Albumin: 4.7 g/dL (ref 3.6–5.1)
Alkaline phosphatase (APISO): 54 U/L (ref 37–153)
BUN: 17 mg/dL (ref 7–25)
CO2: 28 mmol/L (ref 20–32)
Calcium: 9.8 mg/dL (ref 8.6–10.4)
Chloride: 105 mmol/L (ref 98–110)
Creat: 0.88 mg/dL (ref 0.50–1.03)
Globulin: 2 g/dL (calc) (ref 1.9–3.7)
Glucose, Bld: 83 mg/dL (ref 65–139)
Potassium: 4.5 mmol/L (ref 3.5–5.3)
Sodium: 142 mmol/L (ref 135–146)
Total Bilirubin: 0.7 mg/dL (ref 0.2–1.2)
Total Protein: 6.7 g/dL (ref 6.1–8.1)
eGFR: 77 mL/min/{1.73_m2} (ref >=60)

## 2022-09-08 LAB — CBC WITH DIFFERENTIAL/PLATELET
Absolute Monocytes: 446 cells/uL (ref 200–950)
Basophils Absolute: 38 cells/uL (ref 0–200)
Basophils Relative: 0.8 %
Eosinophils Absolute: 58 cells/uL (ref 15–500)
Eosinophils Relative: 1.2 %
HCT: 38.2 % (ref 35.0–45.0)
Hemoglobin: 13 g/dL (ref 11.7–15.5)
Lymphs Abs: 1320 cells/uL (ref 850–3900)
MCH: 30.7 pg (ref 27.0–33.0)
MCHC: 34 g/dL (ref 32.0–36.0)
MCV: 90.1 fL (ref 80.0–100.0)
MPV: 9.5 fL (ref 7.5–12.5)
Monocytes Relative: 9.3 %
Neutro Abs: 2938 cells/uL (ref 1500–7800)
Neutrophils Relative %: 61.2 %
Platelets: 281 10*3/uL (ref 140–400)
RBC: 4.24 10*6/uL (ref 3.80–5.10)
RDW: 12.2 % (ref 11.0–15.0)
Total Lymphocyte: 27.5 %
WBC: 4.8 10*3/uL (ref 3.8–10.8)

## 2022-09-08 LAB — LIPID PANEL
Cholesterol: 233 mg/dL — ABNORMAL HIGH (ref <200)
HDL: 64 mg/dL (ref >=50)
LDL Cholesterol (Calc): 154 mg/dL (calc) — ABNORMAL HIGH
Non-HDL Cholesterol (Calc): 169 mg/dL (calc) — ABNORMAL HIGH (ref <130)
Total CHOL/HDL Ratio: 3.6 (calc) (ref <5.0)
Triglycerides: 60 mg/dL (ref <150)

## 2022-09-08 LAB — TSH: TSH: 2.32 mIU/L (ref 0.40–4.50)

## 2022-09-12 NOTE — Progress Notes (Signed)
Complete physical exam  Patient: Betty Lucas   DOB: [birth year]   56 y.o. Female  MRN: [id number]  Subjective:    Chief Complaint  Patient presents with   Annual Exam    Betty Lucas is a 56 y.o. female who presents today for a complete physical exam. She reports consuming a general diet. Home exercise routine includes cardio and strength. She generally feels well. She reports sleeping well. She does not have additional problems to discuss today.   Most recent fall risk assessment:    08/04/2022   11:42 AM  Fall Risk   Falls in the past year? 0  Number falls in past yr: 0  Injury with Fall? 0  Risk for fall due to : No Fall Risks  Follow up Falls evaluation completed     Most recent depression screenings:    09/13/2022   11:35 AM 08/04/2022   11:42 AM  PHQ 2/9 Scores  PHQ - 2 Score 0 0    Vision:Not within last year , Dental: No current dental problems and Receives regular dental care, and STD: The patient denies history of sexually transmitted disease.    Patient Care Team: Samuel Bouche, NP as PCP - General (Nurse Practitioner) Silverio Decamp, MD as Consulting Physician (Sports Medicine)   Outpatient Medications Prior to Visit  Medication Sig   cholecalciferol (VITAMIN D3) 25 MCG (1000 UNIT) tablet Take 1,000 Units by mouth daily.   Probiotic Product (PROBIOTIC DAILY PO) Take 1 tablet by mouth daily.   [DISCONTINUED] lubiprostone (AMITIZA) 24 MCG capsule TAKE 1 CAPSULE BY MOUTH 2 (TWO) TIMES DAILY WITH A MEAL.   [DISCONTINUED] phentermine (ADIPEX-P) 37.5 MG tablet Take 0.5 tablets (18.75 mg total) by mouth daily.   [DISCONTINUED] valACYclovir (VALTREX) 1000 MG tablet Take 2,'000mg'$  (2 tablets) twice daily for 1 day at start of cold sore symptoms as needed.   No facility-administered medications prior to visit.    Review of Systems  Constitutional:  Positive for malaise/fatigue. Negative for chills, fever and weight loss.  HENT:  Negative for  congestion, ear pain, hearing loss, sinus pain and sore throat.   Eyes:  Negative for blurred vision, photophobia and pain.  Respiratory:  Negative for cough, shortness of breath and wheezing.   Cardiovascular:  Negative for chest pain, palpitations and leg swelling.  Gastrointestinal:  Positive for constipation. Negative for abdominal pain, diarrhea, heartburn, nausea and vomiting.       Intermittent issues with belching and RUQ abdominal pain  Genitourinary:  Negative for dysuria, frequency and urgency.  Musculoskeletal:  Negative for falls and neck pain.  Skin:  Negative for itching and rash.  Neurological:  Negative for dizziness, weakness and headaches.  Endo/Heme/Allergies:  Negative for polydipsia. Does not bruise/bleed easily.  Psychiatric/Behavioral:  Negative for depression, substance abuse and suicidal ideas. The patient is not nervous/anxious and does not have insomnia.      Objective:    BP 99/62 (BP Location: Left Arm, Cuff Size: Normal)   Pulse 69   Resp 20   Ht '5\' 6"'$  (1.676 m)   Wt 174 lb 4.8 oz (79.1 kg)   SpO2 99%   BMI 28.13 kg/m    Physical Exam Constitutional:      General: She is not in acute distress.    Appearance: Normal appearance. She is not ill-appearing.  HENT:     Head: Normocephalic and atraumatic.     Right Ear: Tympanic membrane, ear canal and external ear normal. There  is no impacted cerumen.     Left Ear: Tympanic membrane, ear canal and external ear normal. There is no impacted cerumen.     Nose: Nose normal.     Mouth/Throat:     Mouth: Mucous membranes are moist.     Pharynx: No oropharyngeal exudate or posterior oropharyngeal erythema.  Eyes:     General: No scleral icterus.       Right eye: No discharge.        Left eye: No discharge.     Extraocular Movements: Extraocular movements intact.     Conjunctiva/sclera: Conjunctivae normal.     Pupils: Pupils are equal, round, and reactive to light.  Neck:     Thyroid: No thyromegaly.      Vascular: No carotid bruit or JVD.     Trachea: Trachea normal.  Cardiovascular:     Rate and Rhythm: Normal rate and regular rhythm.     Pulses: Normal pulses.     Heart sounds: Normal heart sounds. No murmur heard.    No friction rub. No gallop.  Pulmonary:     Effort: Pulmonary effort is normal. No respiratory distress.     Breath sounds: Normal breath sounds. No wheezing.  Abdominal:     General: Bowel sounds are normal. There is no distension.     Palpations: Abdomen is soft.     Tenderness: There is no abdominal tenderness. There is no guarding.  Musculoskeletal:        General: Normal range of motion.     Cervical back: Normal range of motion and neck supple.  Lymphadenopathy:     Cervical: No cervical adenopathy.  Skin:    General: Skin is warm and dry.  Neurological:     Mental Status: She is alert and oriented to person, place, and time.     Cranial Nerves: No cranial nerve deficit.  Psychiatric:        Mood and Affect: Mood normal.        Behavior: Behavior normal.        Thought Content: Thought content normal.        Judgment: Judgment normal.      No results found for any visits on 09/13/22.     Assessment & Plan:    Routine Health Maintenance and Physical Exam  Immunization History  Administered Date(s) Administered   Tdap 12/08/2015    Health Maintenance  Topic Date Due   PAP SMEAR-Modifier  09/05/2019   MAMMOGRAM  11/02/2021   INFLUENZA VACCINE  03/20/2023 (Originally 07/20/2022)   TETANUS/TDAP  12/07/2025   COLONOSCOPY (Pts 45-12yr Insurance coverage will need to be confirmed)  09/07/2027   Hepatitis C Screening  Completed   HIV Screening  Completed   HPV VACCINES  Aged Out   COVID-19 Vaccine  Discontinued   Zoster Vaccines- Shingrix  Discontinued    Discussed health benefits of physical activity, and encouraged her to engage in regular exercise appropriate for her age and condition.  1. Annual physical exam Labs completed and resulted.   Reviewed with patient.  Up-to-date on preventative care.  Wellness information provided with AVS.  2. Cervical cancer screening Upcoming appointment with OB/GYN.  3. Influenza vaccination declined Declined vaccine today.  Patient aware of risks versus benefits of vaccination.  4. Irritable bowel syndrome with constipation Continue Amitiza as prescribed. - lubiprostone (AMITIZA) 24 MCG capsule; TAKE 1 CAPSULE BY MOUTH 2 (TWO) TIMES DAILY WITH A MEAL.  Dispense: 180 capsule; Refill: 2  5. Gastroesophageal  reflux disease without esophagitis Starting pantoprazole 40 mg daily.  List of foods that may exacerbate GERD provided with AVS.  6. Cold sore Continue Valtrex as needed. - valACYclovir (VALTREX) 1000 MG tablet; Take 2,'000mg'$  (2 tablets) twice daily for 1 day at start of cold sore symptoms as needed.  Dispense: 30 tablet; Refill: 2  8. Weight gain Discussed current habits.  She is already exercising and eating healthy.  Advised to weigh the portions and log foods into an app so we can see what her nutrition actually looks like.  Discussed various options to help her get a boost on her weight loss.  She would like to start phentermine again.  Advised that this may not be significantly effective as she has done this in the past but willing to give it a try for a couple of months.  Starting at the half tablet dose of phentermine and plan to follow-up in about 4 weeks. - phentermine (ADIPEX-P) 37.5 MG tablet; Take 0.5 tablets (18.75 mg total) by mouth daily.  Dispense: 15 tablet; Refill: 0  Return in about 4 weeks (around 10/11/2022) for GERD/abdominal pain/weight check follow up.   Samuel Bouche, NP

## 2022-09-13 ENCOUNTER — Ambulatory Visit (INDEPENDENT_AMBULATORY_CARE_PROVIDER_SITE_OTHER): Payer: 59 | Admitting: Medical-Surgical

## 2022-09-13 ENCOUNTER — Encounter: Payer: Self-pay | Admitting: Medical-Surgical

## 2022-09-13 VITALS — BP 99/62 | HR 69 | Resp 20 | Ht 66.0 in | Wt 174.3 lb

## 2022-09-13 DIAGNOSIS — K581 Irritable bowel syndrome with constipation: Secondary | ICD-10-CM

## 2022-09-13 DIAGNOSIS — B001 Herpesviral vesicular dermatitis: Secondary | ICD-10-CM

## 2022-09-13 DIAGNOSIS — Z2821 Immunization not carried out because of patient refusal: Secondary | ICD-10-CM

## 2022-09-13 DIAGNOSIS — Z124 Encounter for screening for malignant neoplasm of cervix: Secondary | ICD-10-CM | POA: Diagnosis not present

## 2022-09-13 DIAGNOSIS — Z0000 Encounter for general adult medical examination without abnormal findings: Secondary | ICD-10-CM | POA: Diagnosis not present

## 2022-09-13 DIAGNOSIS — Z23 Encounter for immunization: Secondary | ICD-10-CM

## 2022-09-13 DIAGNOSIS — K219 Gastro-esophageal reflux disease without esophagitis: Secondary | ICD-10-CM

## 2022-09-13 DIAGNOSIS — R635 Abnormal weight gain: Secondary | ICD-10-CM

## 2022-09-13 MED ORDER — VALACYCLOVIR HCL 1 G PO TABS: ORAL_TABLET | ORAL | 2 refills | Status: DC

## 2022-09-13 MED ORDER — LUBIPROSTONE 24 MCG PO CAPS: ORAL_CAPSULE | ORAL | 2 refills | Status: DC

## 2022-09-13 MED ORDER — PHENTERMINE HCL 37.5 MG PO TABS: 18.7500 mg | ORAL_TABLET | Freq: Every day | ORAL | 0 refills | Status: DC

## 2022-09-13 MED ORDER — PANTOPRAZOLE SODIUM 40 MG PO TBEC: 40.0000 mg | DELAYED_RELEASE_TABLET | Freq: Every day | ORAL | 3 refills | Status: DC

## 2022-12-22 ENCOUNTER — Encounter: Payer: Self-pay | Admitting: Medical-Surgical

## 2023-07-28 ENCOUNTER — Encounter: Payer: Self-pay | Admitting: Medical-Surgical

## 2023-09-05 ENCOUNTER — Telehealth: Payer: Self-pay | Admitting: Medical-Surgical

## 2023-09-05 DIAGNOSIS — Z0000 Encounter for general adult medical examination without abnormal findings: Secondary | ICD-10-CM

## 2023-09-05 NOTE — Telephone Encounter (Signed)
Patient called in wanting lab work before her physical appointment on 10/14

## 2023-09-13 NOTE — Telephone Encounter (Signed)
I have placed the orders and contacted the patient through MyChart.

## 2023-09-29 LAB — CMP14+EGFR
ALT: 9 [IU]/L (ref 0–32)
AST: 13 [IU]/L (ref 0–40)
Albumin: 4.5 g/dL (ref 3.8–4.9)
Alkaline Phosphatase: 73 [IU]/L (ref 44–121)
BUN/Creatinine Ratio: 21 (ref 9–23)
BUN: 18 mg/dL (ref 6–24)
Bilirubin Total: 0.3 mg/dL (ref 0.0–1.2)
CO2: 26 mmol/L (ref 20–29)
Calcium: 9.5 mg/dL (ref 8.7–10.2)
Chloride: 102 mmol/L (ref 96–106)
Creatinine, Ser: 0.87 mg/dL (ref 0.57–1.00)
Globulin, Total: 2.3 g/dL (ref 1.5–4.5)
Glucose: 78 mg/dL (ref 70–99)
Potassium: 4.6 mmol/L (ref 3.5–5.2)
Sodium: 141 mmol/L (ref 134–144)
Total Protein: 6.8 g/dL (ref 6.0–8.5)
eGFR: 78 mL/min/{1.73_m2} (ref >59)

## 2023-09-29 LAB — CBC WITH DIFFERENTIAL/PLATELET
Basophils Absolute: 0.1 10*3/uL (ref 0.0–0.2)
Basos: 1 %
EOS (ABSOLUTE): 0.2 10*3/uL (ref 0.0–0.4)
Eos: 4 %
Hematocrit: 40.8 % (ref 34.0–46.6)
Hemoglobin: 13.4 g/dL (ref 11.1–15.9)
Immature Grans (Abs): 0 10*3/uL (ref 0.0–0.1)
Immature Granulocytes: 0 %
Lymphocytes Absolute: 1.3 10*3/uL (ref 0.7–3.1)
Lymphs: 26 %
MCH: 30.5 pg (ref 26.6–33.0)
MCHC: 32.8 g/dL (ref 31.5–35.7)
MCV: 93 fL (ref 79–97)
Monocytes Absolute: 0.4 10*3/uL (ref 0.1–0.9)
Monocytes: 9 %
Neutrophils Absolute: 2.9 10*3/uL (ref 1.4–7.0)
Neutrophils: 60 %
Platelets: 315 10*3/uL (ref 150–450)
RBC: 4.4 x10E6/uL (ref 3.77–5.28)
RDW: 12.3 % (ref 11.7–15.4)
WBC: 4.9 10*3/uL (ref 3.4–10.8)

## 2023-09-29 LAB — LIPID PANEL
Chol/HDL Ratio: 4 {ratio} (ref 0.0–4.4)
Cholesterol, Total: 237 mg/dL — ABNORMAL HIGH (ref 100–199)
HDL: 60 mg/dL (ref >39)
LDL Chol Calc (NIH): 165 mg/dL — ABNORMAL HIGH (ref 0–99)
Triglycerides: 69 mg/dL (ref 0–149)
VLDL Cholesterol Cal: 12 mg/dL (ref 5–40)

## 2023-09-29 LAB — HEMOGLOBIN A1C
Est. average glucose Bld gHb Est-mCnc: 108 mg/dL
Hgb A1c MFr Bld: 5.4 % (ref 4.8–5.6)

## 2023-09-29 LAB — TSH: TSH: 2.22 u[IU]/mL (ref 0.450–4.500)

## 2023-10-03 ENCOUNTER — Ambulatory Visit (INDEPENDENT_AMBULATORY_CARE_PROVIDER_SITE_OTHER): Payer: 59 | Admitting: Medical-Surgical

## 2023-10-03 ENCOUNTER — Encounter: Payer: Self-pay | Admitting: Medical-Surgical

## 2023-10-03 VITALS — BP 93/59 | HR 72 | Resp 20 | Ht 66.0 in | Wt 174.5 lb

## 2023-10-03 DIAGNOSIS — Z0000 Encounter for general adult medical examination without abnormal findings: Secondary | ICD-10-CM

## 2023-10-03 DIAGNOSIS — K581 Irritable bowel syndrome with constipation: Secondary | ICD-10-CM

## 2023-10-03 MED ORDER — LUBIPROSTONE 24 MCG PO CAPS: ORAL_CAPSULE | ORAL | 2 refills | Status: DC

## 2023-10-03 MED ORDER — VENLAFAXINE HCL ER 37.5 MG PO CP24: 37.5000 mg | ORAL_CAPSULE | Freq: Every day | ORAL | 0 refills | Status: DC

## 2023-10-03 NOTE — Patient Instructions (Signed)
Preventive Care 57-57 Years Old, Female  Preventive care refers to lifestyle choices and visits with your health care provider that can promote health and wellness. Preventive care visits are also called wellness exams.  What can I expect for my preventive care visit?  Counseling  Your health care provider may ask you questions about your:  Medical history, including:  Past medical problems.  Family medical history.  Pregnancy history.  Current health, including:  Menstrual cycle.  Method of birth control.  Emotional well-being.  Home life and relationship well-being.  Sexual activity and sexual health.  Lifestyle, including:  Alcohol, nicotine or tobacco, and drug use.  Access to firearms.  Diet, exercise, and sleep habits.  Work and work Astronomer.  Sunscreen use.  Safety issues such as seatbelt and bike helmet use.  Physical exam  Your health care provider will check your:  Height and weight. These may be used to calculate your BMI (body mass index). BMI is a measurement that tells if you are at a healthy weight.  Waist circumference. This measures the distance around your waistline. This measurement also tells if you are at a healthy weight and may help predict your risk of certain diseases, such as type 2 diabetes and high blood pressure.  Heart rate and blood pressure.  Body temperature.  Skin for abnormal spots.  What immunizations do I need?    Vaccines are usually given at various ages, according to a schedule. Your health care provider will recommend vaccines for you based on your age, medical history, and lifestyle or other factors, such as travel or where you work.  What tests do I need?  Screening  Your health care provider may recommend screening tests for certain conditions. This may include:  Lipid and cholesterol levels.  Diabetes screening. This is done by checking your blood sugar (glucose) after you have not eaten for a while (fasting).  Pelvic exam and Pap test.  Hepatitis B test.  Hepatitis C  test.  HIV (human immunodeficiency virus) test.  STI (sexually transmitted infection) testing, if you are at risk.  Lung cancer screening.  Colorectal cancer screening.  Mammogram. Talk with your health care provider about when you should start having regular mammograms. This may depend on whether you have a family history of breast cancer.  BRCA-related cancer screening. This may be done if you have a family history of breast, ovarian, tubal, or peritoneal cancers.  Bone density scan. This is done to screen for osteoporosis.  Talk with your health care provider about your test results, treatment options, and if necessary, the need for more tests.  Follow these instructions at home:  Eating and drinking    Eat a diet that includes fresh fruits and vegetables, whole grains, lean protein, and low-fat dairy products.  Take vitamin and mineral supplements as recommended by your health care provider.  Do not drink alcohol if:  Your health care provider tells you not to drink.  You are pregnant, may be pregnant, or are planning to become pregnant.  If you drink alcohol:  Limit how much you have to 0-1 drink a day.  Know how much alcohol is in your drink. In the [place] one drink equals one 12 oz bottle of beer (355 mL), one 5 oz glass of wine (148 mL), or one 1 oz glass of hard liquor (44 mL).  Lifestyle  Brush your teeth every morning and night with fluoride toothpaste. Floss one time each day.  Exercise for at least  30 minutes 5 or more days each week.  Do not use any products that contain nicotine or tobacco. These products include cigarettes, chewing tobacco, and vaping devices, such as e-cigarettes. If you need help quitting, ask your health care provider.  Do not use drugs.  If you are sexually active, practice safe sex. Use a condom or other form of protection to prevent STIs.  If you do not wish to become pregnant, use a form of birth control. If you plan to become pregnant, see your health care provider for a  prepregnancy visit.  Take aspirin only as told by your health care provider. Make sure that you understand how much to take and what form to take. Work with your health care provider to find out whether it is safe and beneficial for you to take aspirin daily.  Find healthy ways to manage stress, such as:  Meditation, yoga, or listening to music.  Journaling.  Talking to a trusted person.  Spending time with friends and family.  Minimize exposure to UV radiation to reduce your risk of skin cancer.  Safety  Always wear your seat belt while driving or riding in a vehicle.  Do not drive:  If you have been drinking alcohol. Do not ride with someone who has been drinking.  When you are tired or distracted.  While texting.  If you have been using any mind-altering substances or drugs.  Wear a helmet and other protective equipment during sports activities.  If you have firearms in your house, make sure you follow all gun safety procedures.  Seek help if you have been physically or sexually abused.  What's next?  Visit your health care provider once a year for an annual wellness visit.  Ask your health care provider how often you should have your eyes and teeth checked.  Stay up to date on all vaccines.  This information is not intended to replace advice given to you by your health care provider. Make sure you discuss any questions you have with your health care provider.  Document Revised: 06/03/2021 Document Reviewed: 06/03/2021  Elsevier Patient Education  2024 [employer].

## 2023-10-03 NOTE — Progress Notes (Unsigned)
Complete physical exam  Patient: Betty Lucas   DOB: December 08, 1966   57 y.o. Female  MRN: [id number]  Subjective:    No chief complaint on file.  Betty Lucas is a 58 y.o. female who presents today for a complete physical exam. She reports consuming a general diet.  Cardio and gym four times per week.  She generally feels fairly well. She reports sleeping poorly. She does not have additional problems to discuss today.    Most recent fall risk assessment:    08/04/2022   11:42 AM  Fall Risk   Falls in the past year? 0  Number falls in past yr: 0  Injury with Fall? 0  Risk for fall due to : No Fall Risks  Follow up Falls evaluation completed     Most recent depression screenings:    09/13/2022   11:35 AM 08/04/2022   11:42 AM  PHQ 2/9 Scores  PHQ - 2 Score 0 0    Vision:Within last year, Dental: No current dental problems and Receives regular dental care, and STD: The patient denies history of sexually transmitted disease.  {History (Optional):23778}  Patient Care Team: Christen Butter, NP as PCP - General (Nurse Practitioner) Monica Becton, MD as Consulting Physician (Sports Medicine)   Outpatient Medications Prior to Visit  Medication Sig   cholecalciferol (VITAMIN D3) 25 MCG (1000 UNIT) tablet Take 1,000 Units by mouth daily.   lubiprostone (AMITIZA) 24 MCG capsule TAKE 1 CAPSULE BY MOUTH 2 (TWO) TIMES DAILY WITH A MEAL.   pantoprazole (PROTONIX) 40 MG tablet Take 1 tablet (40 mg total) by mouth daily.   phentermine (ADIPEX-P) 37.5 MG tablet Take 0.5 tablets (18.75 mg total) by mouth daily.   Probiotic Product (PROBIOTIC DAILY PO) Take 1 tablet by mouth daily.   valACYclovir (VALTREX) 1000 MG tablet Take 2,000mg  (2 tablets) twice daily for 1 day at start of cold sore symptoms as needed.   No facility-administered medications prior to visit.    Review of Systems  Constitutional:  Negative for chills, fever, malaise/fatigue and weight loss.  HENT:  Negative  for congestion, ear pain, hearing loss, sinus pain and sore throat.   Eyes:  Negative for blurred vision, photophobia and pain.  Respiratory:  Negative for cough, shortness of breath and wheezing.   Cardiovascular:  Negative for chest pain, palpitations and leg swelling.  Gastrointestinal:  Negative for abdominal pain, constipation, diarrhea, heartburn, nausea and vomiting.  Genitourinary:  Negative for dysuria, frequency and urgency.  Musculoskeletal:  Negative for falls and neck pain.  Skin:  Negative for itching and rash.  Neurological:  Negative for dizziness, weakness and headaches.  Endo/Heme/Allergies:  Negative for polydipsia. Does not bruise/bleed easily.  Psychiatric/Behavioral:  Negative for depression, substance abuse and suicidal ideas. The patient has insomnia. The patient is not nervous/anxious.      Objective:    There were no vitals taken for this visit. {Vitals History (Optional):23777}  Physical Exam   No results found for any visits on 10/03/23. {Show previous labs (optional):23779}    Assessment & Plan:    Routine Health Maintenance and Physical Exam  Immunization History  Administered Date(s) Administered   Tdap 12/08/2015    Health Maintenance  Topic Date Due   Cervical Cancer Screening (HPV/Pap Cotest)  09/04/2017   MAMMOGRAM  11/02/2021   INFLUENZA VACCINE  Never done   DTaP/Tdap/Td (2 - Td or Tdap) 12/07/2025   Colonoscopy  09/07/2027   Hepatitis C Screening  Completed  HIV Screening  Completed   HPV VACCINES  Aged Out   COVID-19 Vaccine  Discontinued   Zoster Vaccines- Shingrix  Discontinued    Discussed health benefits of physical activity, and encouraged her to engage in regular exercise appropriate for her age and condition.  Problem List Items Addressed This Visit   None Visit Diagnoses     Annual physical exam    -  Primary      No follow-ups on file.     Christen Butter, NP

## 2023-12-16 LAB — HM MAMMOGRAPHY

## 2023-12-19 ENCOUNTER — Encounter: Payer: Self-pay | Admitting: Medical-Surgical

## 2024-01-12 ENCOUNTER — Telehealth: Payer: Self-pay | Admitting: Medical-Surgical

## 2024-01-12 DIAGNOSIS — R1031 Right lower quadrant pain: Secondary | ICD-10-CM

## 2024-01-12 DIAGNOSIS — K581 Irritable bowel syndrome with constipation: Secondary | ICD-10-CM

## 2024-01-12 DIAGNOSIS — R142 Eructation: Secondary | ICD-10-CM

## 2024-01-12 NOTE — Addendum Note (Signed)
Addended byChristen Butter on: 01/12/2024 08:09 PM   Modules accepted: Orders

## 2024-01-12 NOTE — Telephone Encounter (Signed)
Referral placed although that Dr's name is not one of the providers with the location specified.   ___________________________________________ Thayer Ohm, DNP, APRN, FNP-BC Primary Care and Sports Medicine [facility] [place]

## 2024-01-12 NOTE — Telephone Encounter (Signed)
Copied from CRM [id number]. Topic: Referral - Request for Referral >> Jan 12, 2024  3:38 PM Nila Nephew wrote: Did the patient discuss referral with their provider in the last year? Yes  Appointment offered? No  Type of order/referral and detailed reason for visit: Referral through GI - Has been having periodic but intense issues with pain in lower right quadrant with excessive burping and mental taste in mouth.   Preference of office, provider, location: Dr. Charlyne Petrin at [facility] in [place]  If referral order, have you been seen by this specialty before? Yes - Seen for colonoscopies  Can we respond through MyChart? No - Prefer a phone call but MyChart is OK

## 2024-01-31 ENCOUNTER — Emergency Department (HOSPITAL_COMMUNITY): Payer: 59

## 2024-01-31 ENCOUNTER — Emergency Department (HOSPITAL_COMMUNITY)
Admission: EM | Admit: 2024-01-31 | Discharge: 2024-01-31 | Disposition: A | Discharge status: Home / Self Care | Payer: 59 | Attending: Emergency Medicine | Admitting: Emergency Medicine

## 2024-01-31 ENCOUNTER — Other Ambulatory Visit: Payer: Self-pay

## 2024-01-31 ENCOUNTER — Encounter (HOSPITAL_COMMUNITY): Payer: Self-pay

## 2024-01-31 DIAGNOSIS — N83201 Unspecified ovarian cyst, right side: Secondary | ICD-10-CM | POA: Diagnosis not present

## 2024-01-31 DIAGNOSIS — R1031 Right lower quadrant pain: Secondary | ICD-10-CM | POA: Diagnosis present

## 2024-01-31 DIAGNOSIS — R102 Pelvic and perineal pain: Secondary | ICD-10-CM

## 2024-01-31 DIAGNOSIS — N83299 Other ovarian cyst, unspecified side: Secondary | ICD-10-CM

## 2024-01-31 DIAGNOSIS — N83202 Unspecified ovarian cyst, left side: Secondary | ICD-10-CM | POA: Insufficient documentation

## 2024-01-31 LAB — COMPREHENSIVE METABOLIC PANEL
ALT: 10 U/L (ref 0–44)
AST: 14 U/L — ABNORMAL LOW (ref 15–41)
Albumin: 4.3 g/dL (ref 3.5–5.0)
Alkaline Phosphatase: 44 U/L (ref 38–126)
Anion gap: 13 (ref 5–15)
BUN: 15 mg/dL (ref 6–20)
CO2: 23 mmol/L (ref 22–32)
Calcium: 9.2 mg/dL (ref 8.9–10.3)
Chloride: 102 mmol/L (ref 98–111)
Creatinine, Ser: 0.99 mg/dL (ref 0.44–1.00)
GFR, Estimated: >60 mL/min (ref >60)
Glucose, Bld: 118 mg/dL — ABNORMAL HIGH (ref 70–99)
Potassium: 4.2 mmol/L (ref 3.5–5.1)
Sodium: 138 mmol/L (ref 135–145)
Total Bilirubin: 0.5 mg/dL (ref 0.0–1.2)
Total Protein: 6.9 g/dL (ref 6.5–8.1)

## 2024-01-31 LAB — URINALYSIS, ROUTINE W REFLEX MICROSCOPIC
Bilirubin Urine: NEGATIVE
Glucose, UA: NEGATIVE mg/dL
Hgb urine dipstick: NEGATIVE
Ketones, ur: NEGATIVE mg/dL
Leukocytes,Ua: NEGATIVE
Nitrite: NEGATIVE
Protein, ur: NEGATIVE mg/dL
Specific Gravity, Urine: 1.021 (ref 1.005–1.030)
pH: 5 (ref 5.0–8.0)

## 2024-01-31 LAB — CBC
HCT: 41.7 % (ref 36.0–46.0)
Hemoglobin: 13.5 g/dL (ref 12.0–15.0)
MCH: 29.7 pg (ref 26.0–34.0)
MCHC: 32.4 g/dL (ref 30.0–36.0)
MCV: 91.9 fL (ref 80.0–100.0)
Platelets: 365 10*3/uL (ref 150–400)
RBC: 4.54 MIL/uL (ref 3.87–5.11)
RDW: 11.9 % (ref 11.5–15.5)
WBC: 11.1 10*3/uL — ABNORMAL HIGH (ref 4.0–10.5)
nRBC: 0 % (ref 0.0–0.2)

## 2024-01-31 LAB — LIPASE, BLOOD: Lipase: 32 U/L (ref 11–51)

## 2024-01-31 MED ORDER — IOHEXOL 350 MG/ML SOLN
75.0000 mL | Freq: Once | INTRAVENOUS | Status: AC | PRN
  Administered 2024-01-31: 75 mL via INTRAVENOUS

## 2024-01-31 MED ORDER — MORPHINE SULFATE (PF) 2 MG/ML IV SOLN
4.0000 mg | Freq: Once | INTRAVENOUS | Status: AC
  Administered 2024-01-31: 4 mg via INTRAVENOUS
  Filled 2024-01-31: qty 2

## 2024-01-31 MED ORDER — HYDROCODONE-ACETAMINOPHEN 5-325 MG PO TABS: 1.0000 | ORAL_TABLET | Freq: Four times a day (QID) | ORAL | 0 refills | Status: AC | PRN

## 2024-01-31 MED ORDER — ONDANSETRON HCL 4 MG/2ML IJ SOLN
4.0000 mg | Freq: Once | INTRAMUSCULAR | Status: AC
  Administered 2024-01-31: 4 mg via INTRAVENOUS
  Filled 2024-01-31: qty 2

## 2024-01-31 MED ORDER — MORPHINE SULFATE (PF) 2 MG/ML IV SOLN: 4.0000 mg | Freq: Once | INTRAVENOUS | Status: DC

## 2024-01-31 NOTE — Discharge Instructions (Addendum)
Please use Tylenol or ibuprofen for pain.  You may use 600 mg ibuprofen every 6 hours or 1000 mg of Tylenol every 6 hours.  You may choose to alternate between the 2.  This would be most effective.  Not to exceed 4 g of Tylenol within 24 hours.  Not to exceed 3200 mg ibuprofen 24 hours.  You can use the stronger pain medication in place of Tylenol as needed for severe breakthrough pain.  Please follow-up closely with your OB/GYN, and gastroenterologist regarding your pain and discuss if there is any additional management that they would recommend.  Again I do not see any emergent cause of your pain today. Your urine again is hazy which does not indicate urinary tract infection they are commenting only on the color of the urine.  Your white blood cell count being elevated is normal in the context of pain and is very minimally elevated compared to a normal range of white blood cell values.  Please return if your pain significantly worsens, you have intractable nausea, vomiting, severe diarrhea, constipation or unable to pass gas for several days.

## 2024-01-31 NOTE — ED Provider Triage Note (Signed)
Emergency Medicine Provider Triage Evaluation Note  Betty Lucas , a 58 y.o. female  was evaluated in triage.  Pt complains of RLQ pain, severe since Sunday approx 48 hours, pain does not radiate, never had "anything like this before," does have "chronic abdominal pain" but this is different. No radiation to back. No numbness or tingling. She was feeling NV, but not today. No diarrhea. Reports BM yesterday. Notes no association with food.   Review of Systems  Positive: Abd pain  Negative: Chest pain  Physical Exam  BP 109/76 (BP Location: Left Arm)   Pulse 71   Temp 98.5 F (36.9 C) (Oral)   Resp 20   Ht 5\' 6"  (1.676 m)   Wt 78.9 kg   SpO2 99%   BMI 28.08 kg/m  Gen:   Awake,  distress  due to pain Resp:  Normal effort  MSK:   Moves extremities without difficulty  Other:  Mild RLQ pain   Medical Decision Making  Medically screening exam initiated at 4:54 PM.  Appropriate orders placed.  FALEN LEHRMANN was informed that the remainder of the evaluation will be completed by another provider, this initial triage assessment does not replace that evaluation, and the importance of remaining in the ED until their evaluation is complete.     Smitty Knudsen, PA-C 01/31/24 [id number]

## 2024-01-31 NOTE — ED Triage Notes (Signed)
Patient complains of right lower quad pain that has been going on for years but usually is intermiettent but this time its been consistent since Sunday and has a CT scheduled tomorrow but can't take the pain.  Reports last time they did an [place] had ovarian cyst but small ones.  Patient tearful in triage.

## 2024-01-31 NOTE — ED Provider Notes (Signed)
[place] EMERGENCY DEPARTMENT AT [facility] Provider Note   CSN: [id number] Arrival date & time: 01/31/24  1615     History  Chief Complaint  Patient presents with   Abdominal Pain    Betty Lucas is a 58 y.o. female with past medical history significant for GERD, IBS with known cysts on ovaries who presents concern for right lower quadrant abdominal pain that has been going on for years, she describes it as normally intermittent, coming and going in a few minutes or hours but reports this time it has been consistent since Sunday.  She reports that she had a CT scheduled for tomorrow but cannot take the pain at this time.  Tearful in triage.  She reports she was in the ED around 4 weeks ago with ultrasound noting some cysts on her right ovary but no other acute findings.  She rates her pain 10/10 at this time.  She reports occasionally nauseous but not at this moment.  She denies significant constipation, diarrhea.  Does endorse previous ovarian cyst surgery, denies other intra-abdominal surgeries.   Abdominal Pain      Home Medications Prior to Admission medications   Medication Sig Start Date End Date Taking? Authorizing Provider  HYDROcodone-acetaminophen (NORCO/VICODIN) 5-325 MG tablet Take 1 tablet by mouth every 6 (six) hours as needed. 01/31/24  Yes Cortavius Montesinos H, PA-C  cholecalciferol (VITAMIN D3) 25 MCG (1000 UNIT) tablet Take 1,000 Units by mouth daily.    [provider]  lubiprostone (AMITIZA) 24 MCG capsule TAKE 1 CAPSULE BY MOUTH 2 (TWO) TIMES DAILY WITH A MEAL. 10/03/23   Christen Butter, NP  Probiotic Product (PROBIOTIC DAILY PO) Take 1 tablet by mouth daily.    [provider]  valACYclovir (VALTREX) 1000 MG tablet Take 2,000mg  (2 tablets) twice daily for 1 day at start of cold sore symptoms as needed. 09/13/22   Christen Butter, NP  venlafaxine XR (EFFEXOR XR) 37.5 MG 24 hr capsule Take 1 capsule (37.5 mg total) by mouth daily with  breakfast. 10/03/23   Christen Butter, NP      Allergies    Patient has no known allergies.    Review of Systems   Review of Systems  Gastrointestinal:  Positive for abdominal pain.  All other systems reviewed and are negative.   Physical Exam Updated Vital Signs BP 105/62 (BP Location: Left Arm)   Pulse 70   Temp 98.5 F (36.9 C) (Oral)   Resp 18   Ht 5\' 6"  (1.676 m)   Wt 78.9 kg   SpO2 99%   BMI 28.08 kg/m  Physical Exam Vitals and nursing note reviewed.  Constitutional:      General: She is not in acute distress.    Appearance: Normal appearance.  HENT:     Head: Normocephalic and atraumatic.  Eyes:     General:        Right eye: No discharge.        Left eye: No discharge.  Cardiovascular:     Rate and Rhythm: Normal rate and regular rhythm.     Heart sounds: No murmur heard.    No friction rub. No gallop.  Pulmonary:     Effort: Pulmonary effort is normal.     Breath sounds: Normal breath sounds.  Abdominal:     General: Bowel sounds are normal.     Palpations: Abdomen is soft.     Comments: Focal ttp in RLQ, no rebound, rigidity, mild guarding  Skin:  General: Skin is warm and dry.     Capillary Refill: Capillary refill takes less than 2 seconds.  Neurological:     Mental Status: She is alert and oriented to person, place, and time.  Psychiatric:        Mood and Affect: Mood normal.        Behavior: Behavior normal.     ED Results / Procedures / Treatments   Labs (all labs ordered are listed, but only abnormal results are displayed) Labs Reviewed  COMPREHENSIVE METABOLIC PANEL - Abnormal; Notable for the following components:      Result Value   Glucose, Bld 118 (*)    AST 14 (*)    All other components within normal limits  CBC - Abnormal; Notable for the following components:   WBC 11.1 (*)    All other components within normal limits  URINALYSIS, ROUTINE W REFLEX MICROSCOPIC - Abnormal; Notable for the following components:   APPearance  HAZY (*)    All other components within normal limits  LIPASE, BLOOD    EKG None  Radiology US Pelvis Complete Result Date: 01/31/2024 CLINICAL DATA:  Right pelvic pain EXAM: TRANSABDOMINAL AND TRANSVAGINAL ULTRASOUND OF PELVIS DOPPLER ULTRASOUND OF OVARIES TECHNIQUE: Both transabdominal and transvaginal ultrasound examinations of the pelvis were performed. Transabdominal technique was performed for global imaging of the pelvis including uterus, ovaries, adnexal regions, and pelvic cul-de-sac. It was necessary to proceed with endovaginal exam following the transabdominal exam to visualize the pelvic structures. Color and duplex Doppler ultrasound was utilized to evaluate blood flow to the ovaries. COMPARISON:  Same day CT abdomen and pelvis FINDINGS: Uterus Measurements: 6.1 cm in sagittal dimension. No fibroids or other mass visualized. Endometrium Thickness: 3 mm.  No focal abnormality visualized. Right ovary Measurements: 5.3 x 4.3 x 3.0 cm = volume: 34.9 mL. Contains a simple cyst measuring 4.9 x 4.2 x 2.7 cm. Bilobed cyst along the periphery of the right ovary measures 3.2 x 1.9 x 1.3 cm. Left ovary Measurements: 2.0 x 1.8 x 1.3 cm = volume: 2.4 mL. Contains a simple cyst measuring 1.2 x 1.1 x 0.9 cm. Other findings:  No abnormal free fluid. IMPRESSION: 1. No evidence of ovarian torsion. 2. Bilateral ovarian simple cysts, the largest on the right measuring up to 4.9 cm. 3. Bilobed cyst along the periphery of the right ovary measuring up to 3.2 cm may be ovarian or paraovarian. Recommend follow-up pelvic ultrasound examination in 6-12 months to assess stability. Electronically Signed   By: Agustin Cree M.D.   On: 01/31/2024 20:56   US Transvaginal Non-OB Result Date: 01/31/2024 CLINICAL DATA:  Right pelvic pain EXAM: TRANSABDOMINAL AND TRANSVAGINAL ULTRASOUND OF PELVIS DOPPLER ULTRASOUND OF OVARIES TECHNIQUE: Both transabdominal and transvaginal ultrasound examinations of the pelvis were performed.  Transabdominal technique was performed for global imaging of the pelvis including uterus, ovaries, adnexal regions, and pelvic cul-de-sac. It was necessary to proceed with endovaginal exam following the transabdominal exam to visualize the pelvic structures. Color and duplex Doppler ultrasound was utilized to evaluate blood flow to the ovaries. COMPARISON:  Same day CT abdomen and pelvis FINDINGS: Uterus Measurements: 6.1 cm in sagittal dimension. No fibroids or other mass visualized. Endometrium Thickness: 3 mm.  No focal abnormality visualized. Right ovary Measurements: 5.3 x 4.3 x 3.0 cm = volume: 34.9 mL. Contains a simple cyst measuring 4.9 x 4.2 x 2.7 cm. Bilobed cyst along the periphery of the right ovary measures 3.2 x 1.9 x 1.3 cm. Left ovary  Measurements: 2.0 x 1.8 x 1.3 cm = volume: 2.4 mL. Contains a simple cyst measuring 1.2 x 1.1 x 0.9 cm. Other findings:  No abnormal free fluid. IMPRESSION: 1. No evidence of ovarian torsion. 2. Bilateral ovarian simple cysts, the largest on the right measuring up to 4.9 cm. 3. Bilobed cyst along the periphery of the right ovary measuring up to 3.2 cm may be ovarian or paraovarian. Recommend follow-up pelvic ultrasound examination in 6-12 months to assess stability. Electronically Signed   By: Agustin Cree M.D.   On: 01/31/2024 20:56   [place] Art/Ven Flow Abd Pelv Doppler Result Date: 01/31/2024 CLINICAL DATA:  Right pelvic pain EXAM: TRANSABDOMINAL AND TRANSVAGINAL ULTRASOUND OF PELVIS DOPPLER ULTRASOUND OF OVARIES TECHNIQUE: Both transabdominal and transvaginal ultrasound examinations of the pelvis were performed. Transabdominal technique was performed for global imaging of the pelvis including uterus, ovaries, adnexal regions, and pelvic cul-de-sac. It was necessary to proceed with endovaginal exam following the transabdominal exam to visualize the pelvic structures. Color and duplex Doppler ultrasound was utilized to evaluate blood flow to the ovaries. COMPARISON:   Same day CT abdomen and pelvis FINDINGS: Uterus Measurements: 6.1 cm in sagittal dimension. No fibroids or other mass visualized. Endometrium Thickness: 3 mm.  No focal abnormality visualized. Right ovary Measurements: 5.3 x 4.3 x 3.0 cm = volume: 34.9 mL. Contains a simple cyst measuring 4.9 x 4.2 x 2.7 cm. Bilobed cyst along the periphery of the right ovary measures 3.2 x 1.9 x 1.3 cm. Left ovary Measurements: 2.0 x 1.8 x 1.3 cm = volume: 2.4 mL. Contains a simple cyst measuring 1.2 x 1.1 x 0.9 cm. Other findings:  No abnormal free fluid. IMPRESSION: 1. No evidence of ovarian torsion. 2. Bilateral ovarian simple cysts, the largest on the right measuring up to 4.9 cm. 3. Bilobed cyst along the periphery of the right ovary measuring up to 3.2 cm may be ovarian or paraovarian. Recommend follow-up pelvic ultrasound examination in 6-12 months to assess stability. Electronically Signed   By: Agustin Cree M.D.   On: 01/31/2024 20:56   CT ABDOMEN PELVIS W CONTRAST Result Date: 01/31/2024 CLINICAL DATA:  Right lower quadrant abdominal pain EXAM: CT ABDOMEN AND PELVIS WITH CONTRAST TECHNIQUE: Multidetector CT imaging of the abdomen and pelvis was performed using the standard protocol following bolus administration of intravenous contrast. RADIATION DOSE REDUCTION: This exam was performed according to the departmental dose-optimization program which includes automated exposure control, adjustment of the mA and/or kV according to patient size and/or use of iterative reconstruction technique. CONTRAST:  75mL OMNIPAQUE IOHEXOL 350 MG/ML SOLN COMPARISON:  08/09/2022 FINDINGS: Lower chest: No acute pleural or parenchymal lung disease. Hepatobiliary: Stable subcentimeter hepatic cysts. No other focal liver abnormalities. Gallbladder is unremarkable. Pancreas: Unremarkable. No pancreatic ductal dilatation or surrounding inflammatory changes. Spleen: Normal in size without focal abnormality. Adrenals/Urinary Tract: Stable exophytic  cyst off the lower pole left kidney, which does not require specific follow-up. Otherwise the kidneys enhance normally. No urinary tract calculi or obstructive uropathy. Bladder is minimally distended, limiting its evaluation. Adrenals appear normal. Stomach/Bowel: No bowel obstruction or ileus. Normal appendix right lower quadrant. No bowel wall thickening or inflammatory change. Vascular/Lymphatic: No significant vascular findings are present. No enlarged abdominal or pelvic lymph nodes. Reproductive: Right ovarian simple cyst measures 5.3 x 5.9 x 4.6 cm. Uterus and left adnexa are unremarkable. Other: There are small cystic areas seen within the lower pelvis. On the right, immediately deep to the rectus musculature, there is a 1.8  x 1.5 cm region image 64/3. Second area is seen just proximal to the right inguinal canal, measuring 1.8 x 1.7 cm reference image 73/3. A third area is seen within the left posterior pelvis image 68/3 measuring 0.9 x 0.9 cm. These areas are nonspecific, but unchanged since prior study. Does the patient have any symptoms compatible with endometriosis? Further evaluation with nonemergent pelvic MRI could be considered. No free fluid or free intraperitoneal gas. No abdominal wall hernia. Musculoskeletal: No acute or destructive bony abnormalities. Reconstructed images demonstrate no additional findings. IMPRESSION: 1. No acute intra-abdominal or intrapelvic process. Normal appendix. 2. Small cystic areas within the mesentery of the lower pelvis, unchanged since prior exam. The appearance is nonspecific, and could be small seromas or pelvic inclusion cysts related to sequela of previous pelvic surgery or endometriosis. Further evaluation with gynecologic consultation and nonemergent outpatient pelvic MRI could be considered. 3. Right ovarian simple-appearing cyst measuring 5.9 cm. Recommend follow-up pelvic ultrasound in 6-12 months. Reference: JACR 2020 Feb;17(2):248-254 Electronically  Signed   By: Sharlet Salina M.D.   On: 01/31/2024 19:30    Procedures Procedures    Medications Ordered in ED Medications  ondansetron (ZOFRAN) injection 4 mg (4 mg Intravenous Given 01/31/24 1717)  morphine (PF) 2 MG/ML injection 4 mg (4 mg Intravenous Given 01/31/24 1717)  iohexol (OMNIPAQUE) 350 MG/ML injection 75 mL (75 mLs Intravenous Contrast Given 01/31/24 1855)    ED Course/ Medical Decision Making/ A&P                                 Medical Decision Making Amount and/or Complexity of Data Reviewed Labs: ordered.   This patient is a 58 y.o. female  who presents to the ED for concern of abdominal pain.   Differential diagnoses prior to evaluation: The emergent differential diagnosis includes, but is not limited to,  The causes of generalized abdominal pain include but are not limited to AAA, mesenteric ischemia, appendicitis, diverticulitis, DKA, gastritis, gastroenteritis, AMI, nephrolithiasis, pancreatitis, peritonitis, adrenal insufficiency,lead poisoning, iron toxicity, intestinal ischemia, constipation, UTI,SBO/LBO, splenic rupture, biliary disease, IBD, IBS, PUD, or hepatitis, Ectopic pregnancy, ovarian torsion, PID. Marland Kitchen This is not an exhaustive differential.   Past Medical History / Co-morbidities / Social History: GERD, IBS with known cysts on ovaries  Additional history: Chart reviewed. Pertinent results include: reviewed labwork, imaging from recent previous ED visits  Physical Exam: Physical exam performed. The pertinent findings include: Focal ttp in RLQ, no rebound, rigidity, mild guarding  Lab Tests/Imaging studies: I personally interpreted labs/imaging and the pertinent results include: UA with no evidence of acute urinary tract infection.  CBC with mild leukocytosis, white blood cells 11.1, likely secondary to pain.  Normal lipase.  CMP overall unremarkable.  Glucose mildly elevated on nonfasting lab value.  I dependently interpreted pelvic ultrasound as well  as CT abdomen pelvis with contrast.  Patient with some pelvic congestion noted, and multiple ovarian cysts on the right, no evidence of acute torsion. I agree with the radiologist interpretation.   Medications: I ordered medication including morphine, Zofran for pain, nausea.  I have reviewed the patients home medicines and have made adjustments as needed.   Disposition: After consideration of the diagnostic results and the patients response to treatment, I feel that patient with no evidence of acute emergent abnormality to explain her pelvic pain, suspect that is related to her pelvic congestion, ovarian cysts.  Discussed differential including IBS, she  reports that she has tried Bentyl in the past with no significant relief.  Given the degree of her pain today and her multiple workups for similar discussed and think is reasonable to send her home with a short course of pain medicine to use for severe breakthrough pain, otherwise discussed that she needs to follow-up with her gastroenterologist and OB/GYN for further evaluation.Marland Kitchen   emergency department workup does not suggest an emergent condition requiring admission or immediate intervention beyond what has been performed at this time. The plan is: as above. The patient is safe for discharge and has been instructed to return immediately for worsening symptoms, change in symptoms or any other concerns.  Final Clinical Impression(s) / ED Diagnoses Final diagnoses:  Pelvic pain in female  Functional ovarian cysts    Rx / DC Orders ED Discharge Orders          Ordered    HYDROcodone-acetaminophen (NORCO/VICODIN) 5-325 MG tablet  Every 6 hours PRN        01/31/24 2114              [place] 01/31/24 2121    Tegeler, Canary Brim, MD 01/31/24 2312

## 2024-02-01 ENCOUNTER — Telehealth: Payer: Self-pay

## 2024-02-01 NOTE — Telephone Encounter (Signed)
To discuss removal of the cyst or her ovaries she is going to have to talk with an obgyn. I will not be able to guide her in this situation.

## 2024-02-01 NOTE — Telephone Encounter (Signed)
Copied from CRM [id number]. Topic: Clinical - Medical Advice >> Feb 01, 2024  9:25 AM Nila Nephew wrote: Reason for CRM: Patient is requesting advisement for a recent ER visit regarding a large ovarian cyst. Patient would like to seek consult with PCP regarding having the cyst and potentially her ovaries removed. Patient requested information or contact through MyChart.

## 2024-02-02 NOTE — Telephone Encounter (Signed)
Patient PCP is out of office today. Attempted  to call patient to inform her that her message may not be answered until PCP returns to office next week. Left a voice mail message requesting a return call.

## 2024-02-02 NOTE — Telephone Encounter (Signed)
Patient informed. Still has some questions . Will call on Monday to see if can schedule appt with Brayton El

## 2024-02-02 NOTE — Telephone Encounter (Signed)
Patient informed that Christen Butter , NP out of office until Monday 02/06/24 and the message would likely not be seen before this time. She is o.k. with this but would still like to have Joy review her CT when she is back at work.

## 2024-02-02 NOTE — Telephone Encounter (Signed)
Patient is just asking that Joy please review her CT from ER and see if she agrees that  is a cyst  and not blocked stool ( states GI doctor told her that could be  stool showing instead of a cyst)  or if could be something else.  She also wanting to know her opinion on what she would recommend. She will be seeing OB-Gyn tomorrow to discuss this and wants to know what Joy's thoughts on this were .

## 2024-02-06 ENCOUNTER — Encounter: Payer: Self-pay | Admitting: Medical-Surgical

## 2024-02-06 ENCOUNTER — Ambulatory Visit (INDEPENDENT_AMBULATORY_CARE_PROVIDER_SITE_OTHER): Payer: 59 | Admitting: Medical-Surgical

## 2024-02-06 VITALS — BP 87/56 | HR 80 | Resp 20 | Ht 66.0 in | Wt 169.6 lb

## 2024-02-06 DIAGNOSIS — R1031 Right lower quadrant pain: Secondary | ICD-10-CM

## 2024-02-06 DIAGNOSIS — K581 Irritable bowel syndrome with constipation: Secondary | ICD-10-CM | POA: Diagnosis not present

## 2024-02-06 LAB — POCT URINALYSIS DIP (CLINITEK)
Bilirubin, UA: NEGATIVE
Blood, UA: NEGATIVE
Glucose, UA: NEGATIVE mg/dL
Ketones, POC UA: NEGATIVE mg/dL
Leukocytes, UA: NEGATIVE
Nitrite, UA: NEGATIVE
POC PROTEIN,UA: NEGATIVE
Spec Grav, UA: 1.02 (ref 1.010–1.025)
Urobilinogen, UA: 0.2 U/dL
pH, UA: 6 (ref 5.0–8.0)

## 2024-02-06 MED ORDER — LUBIPROSTONE 24 MCG PO CAPS: ORAL_CAPSULE | ORAL | 2 refills | Status: DC

## 2024-02-06 NOTE — Progress Notes (Signed)
Established patient visit  History, exam, impression, and plan:  1. Right lower quadrant abdominal pain (Primary) Very pleasant 58 year old female presenting today for hospital discharge follow-up and continued evaluation of her right lower quadrant abdominal/groin pain.  Notes that this has been an intermittent issue over the past 2-3 years.  She has had prior imaging that showed no concerns with an unclear etiology of the pain.  The pain is cyclic in nature and tends to last for a few days to a few weeks before finally resolving.  She has been to see OB/GYN as well as gastroenterology and they have been unable to find a specific cause for her symptoms.  More recently, she went to the ED in January and again in February for the pain.  In February, her workup was normal with the exception of a mildly elevated white blood count of 11.1 and a nearly 6 cm ovarian cyst on the right side.  She spoke with her OB/GYN who reported that there was no reason that she should be making ovarian cysts at her age and being postmenopausal.  The recommendations are to perform a bilateral oophorectomy.  She is very unsure about this and has questions for clarification today that she wanted to ask me as her PCP.  She is concerned that the pain was present prior to any ovarian cyst and is afraid that there may be some other cause that has not been identified.  If this is the case, she will undergo an unnecessary operation.  Of note, she does have significant chronic constipation as noted below.  She is on Amitiza however this only provides minimal to moderate relief of symptoms and she still struggles to have regular bowel movements.  Of other consideration, she indicates the area of pain in the right groin that wraps around the hip joint in a C fashion.  She is very physically active and regularly exercises.  Consider possible hip etiology or labral tear that has gone untreated.  After discussion of possibilities, she  would like to proceed with further imaging of the pelvis with an MRI.  Advised that we will need insurance authorization to obtain this imaging and she would still like to proceed.  If this does not provide helpful information, may consider optimizing bowel function and initiating evaluation of the right hip.  Patient verbalized understanding is agreeable to the plan.  As a precaution today, we did run a POCT urinalysis which was completely normal to rule out concern for kidney stone. - MR Pelvis W Wo Contrast; Future  2. Irritable bowel syndrome with constipation Has been on Amitiza 24 mcg twice daily for a while and reports that it does work some but not greatly.  Found some Senokot when cleaning out her mother-in-law's house and decided to try that.  She took 1 to 2 tablets daily for about 3 days and found it to be extremely helpful.  Plan to continue Amitiza as prescribed.  If necessary, feel that it is okay to take Senokot on an as-needed basis.  If she would like to run this by her gastroenterologist, I would encourage her to do so to get definitive clearance. - lubiprostone (AMITIZA) 24 MCG capsule; TAKE 1 CAPSULE BY MOUTH 2 (TWO) TIMES DAILY WITH A MEAL.  Dispense: 180 capsule; Refill: 2 - POCT URINALYSIS DIP (CLINITEK)  Procedures performed this visit: None.  Return if symptoms worsen or fail to improve.  __________________________________ Thayer Ohm, DNP, APRN,  FNP-BC Primary [facility] and [facility] Medicine [facility] [place]

## 2024-02-13 ENCOUNTER — Ambulatory Visit: Payer: 59

## 2024-02-13 DIAGNOSIS — R1031 Right lower quadrant pain: Secondary | ICD-10-CM

## 2024-02-13 MED ORDER — GADOBUTROL 1 MMOL/ML IV SOLN
7.5000 mL | Freq: Once | INTRAVENOUS | Status: AC | PRN
  Administered 2024-02-13: 7.5 mL via INTRAVENOUS

## 2024-02-14 ENCOUNTER — Encounter: Payer: Self-pay | Admitting: Medical-Surgical

## 2024-10-01 ENCOUNTER — Ambulatory Visit: Admitting: Family Medicine

## 2024-10-03 ENCOUNTER — Telehealth: Payer: Self-pay

## 2024-10-03 DIAGNOSIS — E785 Hyperlipidemia, unspecified: Secondary | ICD-10-CM

## 2024-10-03 DIAGNOSIS — Z0000 Encounter for general adult medical examination without abnormal findings: Secondary | ICD-10-CM

## 2024-10-03 NOTE — Telephone Encounter (Signed)
 Preventative care labs ordered. Thanks, Zada

## 2024-10-03 NOTE — Telephone Encounter (Signed)
 Copied from CRM #[id number]. Topic: Clinical - Request for Lab/Test Order >> Oct 03, 2024  1:05 PM Tobias CROME wrote: Reason for CRM: Patient has upcoming physical appointment on 10/08/2024. Patient is wanting to come in tomorrow for physical labs and requesting for orders to be placed.   Requesting call once orders are placed to get labs, [phone]

## 2024-10-04 NOTE — Telephone Encounter (Signed)
 Last read by Reena JAYSON Fell at 12:10AM on 10/04/2024.

## 2024-10-05 ENCOUNTER — Ambulatory Visit: Payer: Self-pay | Admitting: Medical-Surgical

## 2024-10-05 LAB — CMP14+EGFR
ALT: 7 IU/L (ref 0–32)
AST: 15 IU/L (ref 0–40)
Albumin: 4.6 g/dL (ref 3.8–4.9)
Alkaline Phosphatase: 70 IU/L (ref 49–135)
BUN/Creatinine Ratio: 21 (ref 9–23)
BUN: 17 mg/dL (ref 6–24)
Bilirubin Total: 0.4 mg/dL (ref 0.0–1.2)
CO2: 23 mmol/L (ref 20–29)
Calcium: 9.6 mg/dL (ref 8.7–10.2)
Chloride: 106 mmol/L (ref 96–106)
Creatinine, Ser: 0.82 mg/dL (ref 0.57–1.00)
Globulin, Total: 2.2 g/dL (ref 1.5–4.5)
Glucose: 88 mg/dL (ref 70–99)
Potassium: 4.4 mmol/L (ref 3.5–5.2)
Sodium: 145 mmol/L — ABNORMAL HIGH (ref 134–144)
Total Protein: 6.8 g/dL (ref 6.0–8.5)
eGFR: 83 mL/min/1.73 (ref >59)

## 2024-10-05 LAB — CBC
Hematocrit: 42.4 % (ref 34.0–46.6)
Hemoglobin: 13.5 g/dL (ref 11.1–15.9)
MCH: 29.4 pg (ref 26.6–33.0)
MCHC: 31.8 g/dL (ref 31.5–35.7)
MCV: 92 fL (ref 79–97)
Platelets: 311 x10E3/uL (ref 150–450)
RBC: 4.59 x10E6/uL (ref 3.77–5.28)
RDW: 11.9 % (ref 11.7–15.4)
WBC: 4.8 x10E3/uL (ref 3.4–10.8)

## 2024-10-05 LAB — LIPID PANEL
Chol/HDL Ratio: 3.6 ratio (ref 0.0–4.4)
Cholesterol, Total: 230 mg/dL — ABNORMAL HIGH (ref 100–199)
HDL: 64 mg/dL (ref >39)
LDL Chol Calc (NIH): 157 mg/dL — ABNORMAL HIGH (ref 0–99)
Triglycerides: 56 mg/dL (ref 0–149)
VLDL Cholesterol Cal: 9 mg/dL (ref 5–40)

## 2024-10-08 ENCOUNTER — Ambulatory Visit: Payer: 59 | Admitting: Medical-Surgical

## 2024-10-08 ENCOUNTER — Encounter: Payer: Self-pay | Admitting: Medical-Surgical

## 2024-10-08 VITALS — BP 95/59 | HR 82 | Resp 20 | Ht 66.0 in | Wt 172.6 lb

## 2024-10-08 DIAGNOSIS — Z0000 Encounter for general adult medical examination without abnormal findings: Secondary | ICD-10-CM | POA: Diagnosis not present

## 2024-10-08 DIAGNOSIS — K581 Irritable bowel syndrome with constipation: Secondary | ICD-10-CM | POA: Diagnosis not present

## 2024-10-08 DIAGNOSIS — B001 Herpesviral vesicular dermatitis: Secondary | ICD-10-CM

## 2024-10-08 MED ORDER — LUBIPROSTONE 24 MCG PO CAPS: ORAL_CAPSULE | ORAL | 2 refills | Status: AC

## 2024-10-08 MED ORDER — VALACYCLOVIR HCL 1 G PO TABS: ORAL_TABLET | ORAL | 2 refills | Status: AC

## 2024-10-08 NOTE — Progress Notes (Unsigned)
 Complete physical exam  Patient: Betty Lucas   DOB: 12/09/1966   58 y.o. Female  MRN: [id number]  Subjective:    Chief Complaint  Patient presents with   Annual Exam   Betty Lucas is a 58 y.o. female who presents today for a complete physical exam. She reports consuming a general diet. Gym/ health club routine includes cardio and light weights. She generally feels well. She reports sleeping fairly well. She does not have additional problems to discuss today.    Most recent fall risk assessment:    10/03/2023    2:31 PM  Fall Risk   Falls in the past year? 0  Number falls in past yr: 0  Injury with Fall? 0  Risk for fall due to : No Fall Risks  Follow up Falls evaluation completed     Most recent depression screenings:    10/03/2023    2:31 PM 09/13/2022   11:35 AM  PHQ 2/9 Scores  PHQ - 2 Score 0 0    Vision:Within last year and Dental: No current dental problems and Receives regular dental care  {History (Optional):23778}  Patient Care Team: Willo Mini, NP as PCP - General (Nurse Practitioner) Curtis Debby PARAS, MD as Consulting Physician (Sports Medicine)   Outpatient Medications Prior to Visit  Medication Sig   cholecalciferol (VITAMIN D3) 25 MCG (1000 UNIT) tablet Take 1,000 Units by mouth daily.   HYDROcodone -acetaminophen  (NORCO/VICODIN) 5-325 MG tablet Take 1 tablet by mouth every 6 (six) hours as needed.   lubiprostone  (AMITIZA ) 24 MCG capsule TAKE 1 CAPSULE BY MOUTH 2 (TWO) TIMES DAILY WITH A MEAL.   Probiotic Product (PROBIOTIC DAILY PO) Take 1 tablet by mouth daily.   valACYclovir  (VALTREX ) 1000 MG tablet Take 2,000mg  (2 tablets) twice daily for 1 day at start of cold sore symptoms as needed.   venlafaxine  XR (EFFEXOR  XR) 37.5 MG 24 hr capsule Take 1 capsule (37.5 mg total) by mouth daily with breakfast.   No facility-administered medications prior to visit.    Review of Systems  Constitutional:  Negative for chills, fever,  malaise/fatigue and weight loss.  HENT:  Negative for congestion, ear pain, hearing loss, sinus pain and sore throat.   Eyes:  Negative for blurred vision, photophobia and pain.  Respiratory:  Negative for cough, shortness of breath and wheezing.   Cardiovascular:  Negative for chest pain, palpitations and leg swelling.  Gastrointestinal:  Negative for abdominal pain, constipation, diarrhea, heartburn, nausea and vomiting.  Genitourinary:  Negative for dysuria, frequency and urgency.  Musculoskeletal:  Negative for falls and neck pain.  Skin:  Negative for itching and rash.  Neurological:  Negative for dizziness, weakness and headaches.  Endo/Heme/Allergies:  Negative for polydipsia. Does not bruise/bleed easily.  Psychiatric/Behavioral:  Negative for depression, substance abuse and suicidal ideas. The patient has insomnia. The patient is not nervous/anxious.      Objective:    BP (!) 95/59 (BP Location: Left Arm, Cuff Size: Normal)   Pulse 82   Resp 20   Ht 5' 6 (1.676 m)   Wt 172 lb 9.6 oz (78.3 kg)   SpO2 95%   BMI 27.86 kg/m  {Vitals History (Optional):23777}  Physical Exam Constitutional:      General: She is not in acute distress.    Appearance: Normal appearance. She is not ill-appearing.  HENT:     Head: Normocephalic and atraumatic.     Right Ear: Tympanic membrane, ear canal and external ear normal. There  is no impacted cerumen.     Left Ear: Tympanic membrane, ear canal and external ear normal. There is no impacted cerumen.     Nose: Nose normal. No congestion or rhinorrhea.     Mouth/Throat:     Mouth: Mucous membranes are moist.     Pharynx: No oropharyngeal exudate or posterior oropharyngeal erythema.  Eyes:     General: No scleral icterus.       Right eye: No discharge.        Left eye: No discharge.     Extraocular Movements: Extraocular movements intact.     Conjunctiva/sclera: Conjunctivae normal.     Pupils: Pupils are equal, round, and reactive to  light.  Neck:     Thyroid : No thyromegaly.     Vascular: No carotid bruit or JVD.     Trachea: Trachea normal.  Cardiovascular:     Rate and Rhythm: Normal rate and regular rhythm.     Pulses: Normal pulses.     Heart sounds: Normal heart sounds. No murmur heard.    No friction rub. No gallop.  Pulmonary:     Effort: Pulmonary effort is normal. No respiratory distress.     Breath sounds: Normal breath sounds. No wheezing.  Abdominal:     General: Bowel sounds are normal. There is no distension.     Palpations: Abdomen is soft.     Tenderness: There is no abdominal tenderness. There is no guarding.  Musculoskeletal:        General: Normal range of motion.     Cervical back: Normal range of motion and neck supple.  Lymphadenopathy:     Cervical: No cervical adenopathy.  Skin:    General: Skin is warm and dry.  Neurological:     Mental Status: She is alert and oriented to person, place, and time.     Cranial Nerves: No cranial nerve deficit.  Psychiatric:        Mood and Affect: Mood normal.        Behavior: Behavior normal.        Thought Content: Thought content normal.        Judgment: Judgment normal.      No results found for any visits on 10/08/24. {Show previous labs (optional):23779}    Assessment & Plan:    Routine Health Maintenance and Physical Exam  Immunization History  Administered Date(s) Administered   Tdap 12/08/2015    Health Maintenance  Topic Date Due   Hepatitis B Vaccines 19-59 Average Risk (1 of 3 - 19+ 3-dose series) Never done   Pneumococcal Vaccine: 50+ Years (1 of 1 - PCV) Never done   Cervical Cancer Screening (HPV/Pap Cotest)  09/05/2019   Influenza Vaccine  03/19/2025 (Originally 07/20/2024)   DTaP/Tdap/Td (2 - Td or Tdap) 12/07/2025   Mammogram  12/15/2025   Colonoscopy  09/07/2027   Hepatitis C Screening  Completed   HIV Screening  Completed   HPV VACCINES  Aged Out   Meningococcal B Vaccine  Aged Out   COVID-19 Vaccine   Discontinued   Zoster Vaccines- Shingrix  Discontinued    Discussed health benefits of physical activity, and encouraged her to engage in regular exercise appropriate for her age and condition.  1. Annual physical exam (Primary) ***  No follow-ups on file.     Rontae Inglett, NP

## 2024-10-08 NOTE — Patient Instructions (Signed)
 Preventive Care 58-58 Years Old, Female  Preventive care refers to lifestyle choices and visits with your health care provider that can promote health and wellness. Preventive care visits are also called wellness exams.  What can I expect for my preventive care visit?  Counseling  Your health care provider may ask you questions about your:  Medical history, including:  Past medical problems.  Family medical history.  Pregnancy history.  Current health, including:  Menstrual cycle.  Method of birth control.  Emotional well-being.  Home life and relationship well-being.  Sexual activity and sexual health.  Lifestyle, including:  Alcohol, nicotine or tobacco, and drug use.  Access to firearms.  Diet, exercise, and sleep habits.  Work and work Astronomer.  Sunscreen use.  Safety issues such as seatbelt and bike helmet use.  Physical exam  Your health care provider will check your:  Height and weight. These may be used to calculate your BMI (body mass index). BMI is a measurement that tells if you are at a healthy weight.  Waist circumference. This measures the distance around your waistline. This measurement also tells if you are at a healthy weight and may help predict your risk of certain diseases, such as type 2 diabetes and high blood pressure.  Heart rate and blood pressure.  Body temperature.  Skin for abnormal spots.  What immunizations do I need?    Vaccines are usually given at various ages, according to a schedule. Your health care provider will recommend vaccines for you based on your age, medical history, and lifestyle or other factors, such as travel or where you work.  What tests do I need?  Screening  Your health care provider may recommend screening tests for certain conditions. This may include:  Lipid and cholesterol levels.  Diabetes screening. This is done by checking your blood sugar (glucose) after you have not eaten for a while (fasting).  Pelvic exam and Pap test.  Hepatitis B test.  Hepatitis C  test.  HIV (human immunodeficiency virus) test.  STI (sexually transmitted infection) testing, if you are at risk.  Lung cancer screening.  Colorectal cancer screening.  Mammogram. Talk with your health care provider about when you should start having regular mammograms. This may depend on whether you have a family history of breast cancer.  BRCA-related cancer screening. This may be done if you have a family history of breast, ovarian, tubal, or peritoneal cancers.  Bone density scan. This is done to screen for osteoporosis.  Talk with your health care provider about your test results, treatment options, and if necessary, the need for more tests.  Follow these instructions at home:  Eating and drinking    Eat a diet that includes fresh fruits and vegetables, whole grains, lean protein, and low-fat dairy products.  Take vitamin and mineral supplements as recommended by your health care provider.  Do not drink alcohol if:  Your health care provider tells you not to drink.  You are pregnant, may be pregnant, or are planning to become pregnant.  If you drink alcohol:  Limit how much you have to 0-1 drink a day.  Know how much alcohol is in your drink. In the [place] one drink equals one 12 oz bottle of beer (355 mL), one 5 oz glass of wine (148 mL), or one 1 oz glass of hard liquor (44 mL).  Lifestyle  Brush your teeth every morning and night with fluoride toothpaste. Floss one time each day.  Exercise for at least  30 minutes 5 or more days each week.  Do not use any products that contain nicotine or tobacco. These products include cigarettes, chewing tobacco, and vaping devices, such as e-cigarettes. If you need help quitting, ask your health care provider.  Do not use drugs.  If you are sexually active, practice safe sex. Use a condom or other form of protection to prevent STIs.  If you do not wish to become pregnant, use a form of birth control. If you plan to become pregnant, see your health care provider for a  prepregnancy visit.  Take aspirin only as told by your health care provider. Make sure that you understand how much to take and what form to take. Work with your health care provider to find out whether it is safe and beneficial for you to take aspirin daily.  Find healthy ways to manage stress, such as:  Meditation, yoga, or listening to music.  Journaling.  Talking to a trusted person.  Spending time with friends and family.  Minimize exposure to UV radiation to reduce your risk of skin cancer.  Safety  Always wear your seat belt while driving or riding in a vehicle.  Do not drive:  If you have been drinking alcohol. Do not ride with someone who has been drinking.  When you are tired or distracted.  While texting.  If you have been using any mind-altering substances or drugs.  Wear a helmet and other protective equipment during sports activities.  If you have firearms in your house, make sure you follow all gun safety procedures.  Seek help if you have been physically or sexually abused.  What's next?  Visit your health care provider once a year for an annual wellness visit.  Ask your health care provider how often you should have your eyes and teeth checked.  Stay up to date on all vaccines.  This information is not intended to replace advice given to you by your health care provider. Make sure you discuss any questions you have with your health care provider.  Document Revised: 06/03/2021 Document Reviewed: 06/03/2021  Elsevier Patient Education  2024 [employer].

## 2025-10-14 ENCOUNTER — Encounter: Admitting: Medical-Surgical
